# Patient Record
Sex: Female | Born: 1960 | Race: White | Hispanic: No | Marital: Single | Smoking: Former smoker
Health system: Southern US, Community
[De-identification: ages and names within clinical notes are randomized; demographics above are authoritative.]

## PROBLEM LIST (undated history)

## (undated) DIAGNOSIS — E119 Type 2 diabetes mellitus without complications: Secondary | ICD-10-CM

## (undated) DIAGNOSIS — J45909 Unspecified asthma, uncomplicated: Secondary | ICD-10-CM

## (undated) DIAGNOSIS — K219 Gastro-esophageal reflux disease without esophagitis: Secondary | ICD-10-CM

## (undated) DIAGNOSIS — T7840XA Allergy, unspecified, initial encounter: Secondary | ICD-10-CM

## (undated) DIAGNOSIS — J449 Chronic obstructive pulmonary disease, unspecified: Secondary | ICD-10-CM

## (undated) DIAGNOSIS — IMO0001 Reserved for inherently not codable concepts without codable children: Secondary | ICD-10-CM

## (undated) HISTORY — DX: Reserved for inherently not codable concepts without codable children: IMO0001

## (undated) HISTORY — DX: Gastro-esophageal reflux disease without esophagitis: K21.9

## (undated) HISTORY — DX: Unspecified asthma, uncomplicated: J45.909

## (undated) HISTORY — DX: Allergy, unspecified, initial encounter: T78.40XA

---

## 1983-12-23 HISTORY — PX: TONSILLECTOMY: SUR1361

## 2000-01-11 ENCOUNTER — Emergency Department (HOSPITAL_COMMUNITY): Admission: EM | Admit: 2000-01-11 | Discharge: 2000-01-11 | Payer: Self-pay | Admitting: Emergency Medicine

## 2000-01-11 ENCOUNTER — Encounter: Payer: Self-pay | Admitting: Emergency Medicine

## 2002-11-07 ENCOUNTER — Encounter: Payer: Self-pay | Admitting: Internal Medicine

## 2002-11-07 ENCOUNTER — Encounter: Admission: RE | Admit: 2002-11-07 | Discharge: 2002-11-07 | Payer: Self-pay | Admitting: Internal Medicine

## 2002-11-11 ENCOUNTER — Encounter: Admission: RE | Admit: 2002-11-11 | Discharge: 2002-11-11 | Payer: Self-pay | Admitting: Internal Medicine

## 2002-11-11 ENCOUNTER — Encounter: Payer: Self-pay | Admitting: Internal Medicine

## 2006-10-01 ENCOUNTER — Emergency Department (HOSPITAL_COMMUNITY): Admission: EM | Admit: 2006-10-01 | Discharge: 2006-10-01 | Payer: Self-pay | Admitting: Family Medicine

## 2006-10-05 ENCOUNTER — Emergency Department (HOSPITAL_COMMUNITY): Admission: EM | Admit: 2006-10-05 | Discharge: 2006-10-05 | Payer: Self-pay | Admitting: Family Medicine

## 2008-02-06 ENCOUNTER — Emergency Department (HOSPITAL_COMMUNITY): Admission: EM | Admit: 2008-02-06 | Discharge: 2008-02-07 | Payer: Self-pay | Admitting: Emergency Medicine

## 2008-02-08 ENCOUNTER — Ambulatory Visit: Payer: Self-pay | Admitting: Internal Medicine

## 2008-02-08 LAB — CONVERTED CEMR LAB
ALT: 1908 units/L — ABNORMAL HIGH (ref 0–35)
AST: 391 units/L — ABNORMAL HIGH (ref 0–37)
Albumin: 3.8 g/dL (ref 3.5–5.2)
Alkaline Phosphatase: 51 units/L (ref 39–117)
Bilirubin, Direct: 0.3 mg/dL (ref 0.0–0.3)
HCV Ab: NEGATIVE
Hep A IgM: NEGATIVE
Hep B C IgM: NEGATIVE
Hepatitis B Surface Ag: NEGATIVE
INR: 1.1 — ABNORMAL HIGH (ref 0.8–1.0)
Prothrombin Time: 12.6 s (ref 10.9–13.3)
Total Bilirubin: 1 mg/dL (ref 0.3–1.2)
Total Protein: 6.3 g/dL (ref 6.0–8.3)

## 2008-02-14 ENCOUNTER — Ambulatory Visit: Payer: Self-pay | Admitting: Internal Medicine

## 2008-02-14 LAB — CONVERTED CEMR LAB
ALT: 234 units/L — ABNORMAL HIGH (ref 0–35)
AST: 44 units/L — ABNORMAL HIGH (ref 0–37)
Albumin: 3.7 g/dL (ref 3.5–5.2)
Alkaline Phosphatase: 59 units/L (ref 39–117)
Anti Nuclear Antibody(ANA): NEGATIVE
Bilirubin, Direct: 0.1 mg/dL (ref 0.0–0.3)
Total Bilirubin: 0.5 mg/dL (ref 0.3–1.2)
Total Protein: 6.5 g/dL (ref 6.0–8.3)

## 2008-02-29 ENCOUNTER — Ambulatory Visit: Payer: Self-pay | Admitting: Internal Medicine

## 2008-02-29 DIAGNOSIS — B199 Unspecified viral hepatitis without hepatic coma: Secondary | ICD-10-CM | POA: Insufficient documentation

## 2008-02-29 DIAGNOSIS — G4700 Insomnia, unspecified: Secondary | ICD-10-CM | POA: Insufficient documentation

## 2008-02-29 LAB — CONVERTED CEMR LAB
ALT: 28 units/L (ref 0–35)
AST: 20 units/L (ref 0–37)
Albumin: 3.7 g/dL (ref 3.5–5.2)
Total Bilirubin: 0.5 mg/dL (ref 0.3–1.2)

## 2010-01-12 ENCOUNTER — Emergency Department (HOSPITAL_BASED_OUTPATIENT_CLINIC_OR_DEPARTMENT_OTHER): Admission: EM | Admit: 2010-01-12 | Discharge: 2010-01-12 | Payer: Self-pay | Admitting: Emergency Medicine

## 2010-01-12 ENCOUNTER — Ambulatory Visit: Payer: Self-pay | Admitting: Diagnostic Radiology

## 2011-03-10 LAB — CBC
HCT: 33.8 % — ABNORMAL LOW (ref 36.0–46.0)
MCHC: 34.3 g/dL (ref 30.0–36.0)
MCV: 84.7 fL (ref 78.0–100.0)
Platelets: 267 10*3/uL (ref 150–400)
RDW: 12.6 % (ref 11.5–15.5)
WBC: 3.9 10*3/uL — ABNORMAL LOW (ref 4.0–10.5)

## 2011-03-10 LAB — DIFFERENTIAL
Basophils Absolute: 0 10*3/uL (ref 0.0–0.1)
Basophils Relative: 1 % (ref 0–1)
Eosinophils Absolute: 0 10*3/uL (ref 0.0–0.7)
Eosinophils Relative: 1 % (ref 0–5)
Neutrophils Relative %: 59 % (ref 43–77)

## 2011-05-06 NOTE — Assessment & Plan Note (Signed)
[facility]                         GASTROENTEROLOGY OFFICE NOTE   AIREN, DALES                      MRN:          [id number]  DATE:02/14/2008                            DOB:          July 20, 1961    CHIEF COMPLAINT:  Followup of hepatitis.   She is feeling better, eating more.  Nausea persists, but is less  treated with promethazine pills.  No vomiting.  Still getting some right  upper quadrant pain.  No fever.  Still very weak and tired, though  again, better.  She does not feel like she can return to work, and she  brought papers for me to fill out for leave.  Her INR was normal on  February 17.  Her AST and ALT had improved to 391 and 1,908.  There is  no description of jaundice.  She still has no taste for cigarettes.   PAST MEDICAL HISTORY:  Reviewed and unchanged from February 08, 2008.   Her acute hepatitis panel came back negative, her hepatitis A, B and C.   PHYSICAL EXAMINATION:  Weight  172 pounds, pulse 72, blood pressure  110/72.  EYES:  Anicteric.  NECK:  Supple.  ABDOMEN: Soft.  She has some mild right upper quadrant tenderness, but  less than before.  There is no hepatosplenomegaly or mass.   She had complained of some itching in a focal area on the right side.  I  see no rash there.  Perhaps a little bit scaly and dry.   ASSESSMENT:  I still think she has had some sort of viral hepatitis.  Perhaps CMV or EBV.  There is no foreign travel to suggest the other  infectious hepatitities that I can tell i.e., the other acute viral  ones.  Auto-immune hepatitis is in differential.   PLAN:  Hepatic function panel, EBV, IgM antibody, CMV, IgM antibody,  ANA.  I filled out her paperwork and anticipated a month's leave due to  her fatigue issues, etc.  We will reassess her in two weeks.  She can go  back sooner if it seemed to make sense, but at this time it seems likely  that that is an appropriate recommendation..  Further  plans pending  clinic course.     Iva Boop, MD,FACG  Electronically Signed    CEG/MedQ  DD: 02/14/2008  DT: 02/14/2008  Job #: [id number]

## 2011-05-06 NOTE — Assessment & Plan Note (Signed)
[facility]                         GASTROENTEROLOGY OFFICE NOTE   DAJANAE, BROPHY                      MRN:          [id number]  DATE:02/29/2008                            DOB:          [birth year]    CHIEF COMPLAINT:  Follow up of hepatitis.   Ms. Zipp is gradually feeling better.  She still has a lot of nausea  and some diarrhea at times.  her LFTs were almost normalized previously.  She has also complained of some constipation at times.  Her bowel habits  are alternating.  She is due to go back to work at the end of this week.  She feels like she is ready to do this, even though she is somewhat  nervous.  Pain is overall a lot less.  She has been somewhat stressed  and anxious over her brother and sister that had myocardial infarctions  recently.  They have both been in the hospital recently.  She is not  sleeping well.  There has been no fever or chills.  No jaundice.  Levbid, she feels made her nauseous, so she is not using that.  She has  used some Phenergan intermittently.  No other medications.  Her ANA came  back negative.  Previous hepatitis A, B, and C serologies were negative.   PAST MEDICAL HISTORY:  Reviewed, and unchanged from prior.   PHYSICAL EXAMINATION:  Weight 173 pounds.  Pulse 78.  Blood pressure  100/70.  EYES:  Are anicteric.  SKIN:  Without jaundice.   ASSESSMENT:  1. Viral hepatitis.  Exact etiology not known.  She is improving.  2. Suspected irritable bowel syndrome, question related to her viral      hepatitis versus other situational stressors.  3. Insomnia.   PLAN:  1. Continue supportive care.  2. She has not smoked since she developed hepatitis, and she is urged      to try to give it up completely.  3. Return to work March 03, 2008.  On my note I indicated that she may      need a few more breaks or shorter hours.  4. Temazepam 15 mg nightly p.r.n., number 30, no refills.  I told her      that I  would not continue this medication chronically, it is a      short term adjunct therapy for her, and that she should see her      primary care physician if she desired further treatment for same.      Further plans pending LFTs regarding followup.     Iva Boop, MD,FACG  Electronically Signed   CEG/MedQ  DD: 02/29/2008  DT: 02/29/2008  Job #: [id number]

## 2011-05-06 NOTE — Assessment & Plan Note (Signed)
[facility]                         GASTROENTEROLOGY OFFICE NOTE   Andrea, Schneider                      MRN:          [id number]  DATE:02/08/2008                            DOB:          1961/01/17    REFERRING PHYSICIAN:  Melvenia Beam A. Upstill, P.A.   CHIEF COMPLAINT:  Hepatitis.   ASSESSMENT:  A 50 year old white woman with an acute hepatitis with  marked transaminitis and mild elevation of INR. At this point, I think  it is an acute viral hepatitis. She had a negative Tylenol level. She is  not a drinker. I suspect hepatitis A based upon what is seen.   PLAN:  Acute hepatitis panel, recheck LFTs and check pro time INR.  Reassess in one week. Out of work for at least one week. Ways to avoid  transmission of infectious hepatitis were explained to the patient.  Hepatitis handout was given to the patient. She is advised to continue  forcing fluids and have a bland diet. Further plans pending lab results  and clinical course.   HISTORY:  A 50 year old white woman who was doing well until last week.  She has developed nausea and vomiting relatively acute, profound  weakness and lost her taste for cigarettes. She went to the emergency  department and was evaluated on February 15th. She had essentially a  normal CBC with a slight left shift. She has a bilirubin of 1.5, AST of  41 and 44 and an ALT 36/87. She was rehydrated with fluids and felt  somewhat better. Her nausea and vomiting has subsided since that time.  She had pro time of 17.6, which was somewhat elevated. Tylenol level was  less than 10. There is no history of alcohol, needles, recent  intercourse, travel, contacts with sick illnesses or even seafood  ingestion. She is feeling better, but is still very weak. She feels  somewhat constipated. There is no significant abdominal pain. Her GI  review of systems is otherwise negative at this time.   She has not taken any particular medications  at this time. She is on  Phenergan intermittently, but has not needed to use that for a couple of  days.   SHE IS ALLERGIC TO MORPHINE AND HIGH DOSE CODEINE.   PAST MEDICAL HISTORY:  1. Tonsillectomy only.  2. Some stress.   FAMILY HISTORY:  Mother and father had heart disease. Father had  diabetes. No colon cancer or liver disease reported.   SOCIAL HISTORY:  She is single. She lives with her brother. 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 No children. She smokes,  but now is not smoking. Occasional alcohol, but nothing significant. No  drug use.   REVIEW OF SYSTEMS:  Is only positive for some dysmenorrhea. Otherwise,  negative.   PHYSICAL EXAMINATION:  Height 5 feet, 4.5 inches; weight 163 pounds.  Temperature 98.0, blood pressure 112/72, pulse 82 and regular.  EYES: Anicteric.  MOUTH: Posterior pharynx is free of lesions. Mucous membranes are moist.  NECK: Is supple without mass, thyromegaly or adenopathy. There is no  axillary adenopathy.  CHEST:  Is clear.  HEART: S1, S2. I hear no murmur, rubs or gallops.  ABDOMEN: Is soft. She has some mild tenderness over the liver though it  is not palpable. There is no splenomegaly. There is no other  organomegaly or mass.  LOWER EXTREMITIES: Free of edema.  SKIN: Warm and dry without acute rash in the areas I can inspect.  NEURO: Sh appears alert and oriented x3. Cranial nerves II-XII  intact.  Grossly nonfocal.  PSYCH: Appropriate affect.   I have reviewed the ER records. She had an abdominal ultrasound that was  normal as well. All other records reviewed were negative as well and  these are reflected in the computer.     Andrea Boop, MD,FACG  Electronically Signed    CEG/MedQ  DD: 02/08/2008  DT: 02/08/2008  Job #: [id number]   cc:   Thora Lance, M.D.  Shari A. Upstill, P.A.

## 2011-05-09 ENCOUNTER — Emergency Department (HOSPITAL_COMMUNITY)
Admission: EM | Admit: 2011-05-09 | Discharge: 2011-05-09 | Disposition: A | Discharge status: Home / Self Care | Payer: BC Managed Care – PPO | Attending: Emergency Medicine | Admitting: Emergency Medicine

## 2011-05-09 DIAGNOSIS — R42 Dizziness and giddiness: Secondary | ICD-10-CM | POA: Insufficient documentation

## 2011-05-09 DIAGNOSIS — R5381 Other malaise: Secondary | ICD-10-CM | POA: Insufficient documentation

## 2011-05-09 DIAGNOSIS — R51 Headache: Secondary | ICD-10-CM | POA: Insufficient documentation

## 2011-05-09 LAB — CBC
Hemoglobin: 12.2 g/dL (ref 12.0–15.0)
MCH: 25.1 pg — ABNORMAL LOW (ref 26.0–34.0)
Platelets: 373 10*3/uL (ref 150–400)
RBC: 4.86 MIL/uL (ref 3.87–5.11)

## 2011-05-09 LAB — COMPREHENSIVE METABOLIC PANEL
ALT: 20 U/L (ref 0–35)
AST: 17 U/L (ref 0–37)
Albumin: 4.1 g/dL (ref 3.5–5.2)
CO2: 26 mEq/L (ref 19–32)
Calcium: 9.5 mg/dL (ref 8.4–10.5)
Chloride: 103 mEq/L (ref 96–112)
Creatinine, Ser: 0.73 mg/dL (ref 0.4–1.2)
GFR calc Af Amer: >60 mL/min (ref >60)
Sodium: 137 mEq/L (ref 135–145)
Total Bilirubin: 0.2 mg/dL — ABNORMAL LOW (ref 0.3–1.2)

## 2011-05-09 LAB — DIFFERENTIAL
Basophils Absolute: 0 10*3/uL (ref 0.0–0.1)
Basophils Relative: 0 % (ref 0–1)
Eosinophils Absolute: 0.1 10*3/uL (ref 0.0–0.7)
Monocytes Relative: 7 % (ref 3–12)
Neutro Abs: 7.4 10*3/uL (ref 1.7–7.7)
Neutrophils Relative %: 77 % (ref 43–77)

## 2011-05-09 LAB — POCT PREGNANCY, URINE: Preg Test, Ur: NEGATIVE

## 2011-05-09 LAB — URINALYSIS, ROUTINE W REFLEX MICROSCOPIC
Bilirubin Urine: NEGATIVE
Ketones, ur: NEGATIVE mg/dL
Nitrite: NEGATIVE
Protein, ur: NEGATIVE mg/dL
Urobilinogen, UA: 0.2 mg/dL (ref 0.0–1.0)

## 2011-09-12 LAB — HEPATITIS PANEL, ACUTE
HCV Ab: NEGATIVE
Hep A IgM: NEGATIVE
Hepatitis B Surface Ag: NEGATIVE

## 2011-09-12 LAB — URINE MICROSCOPIC-ADD ON

## 2011-09-12 LAB — DIFFERENTIAL
Basophils Relative: 0
Lymphocytes Relative: 4 — ABNORMAL LOW
Lymphs Abs: 0.5 — ABNORMAL LOW
Monocytes Absolute: 0.3
Monocytes Relative: 3
Neutro Abs: 9.8 — ABNORMAL HIGH
Neutrophils Relative %: 92 — ABNORMAL HIGH

## 2011-09-12 LAB — COMPREHENSIVE METABOLIC PANEL
Albumin: 4.1
Alkaline Phosphatase: 67
BUN: 8
Calcium: 9.3
Glucose, Bld: 100 — ABNORMAL HIGH
Potassium: 3.7
Total Protein: 7

## 2011-09-12 LAB — URINALYSIS, ROUTINE W REFLEX MICROSCOPIC
Nitrite: NEGATIVE
Protein, ur: >300 — AB
Specific Gravity, Urine: 1.031 — ABNORMAL HIGH
Urobilinogen, UA: 1
pH: 6.5

## 2011-09-12 LAB — CBC
HCT: 38.5
Hemoglobin: 12.8
MCHC: 33.2
Platelets: 343
RDW: 14.8

## 2011-09-12 LAB — ACETAMINOPHEN LEVEL: Acetaminophen (Tylenol), Serum: <10 — ABNORMAL LOW

## 2011-09-12 LAB — RAPID URINE DRUG SCREEN, HOSP PERFORMED
Barbiturates: NOT DETECTED
Cocaine: NOT DETECTED
Opiates: NOT DETECTED
Tetrahydrocannabinol: NOT DETECTED

## 2011-09-12 LAB — PREGNANCY, URINE: Preg Test, Ur: NEGATIVE

## 2011-09-12 LAB — PROTIME-INR
INR: 1.4
Prothrombin Time: 17.6 — ABNORMAL HIGH

## 2013-05-27 ENCOUNTER — Emergency Department (HOSPITAL_COMMUNITY): Payer: BC Managed Care – PPO

## 2013-05-27 ENCOUNTER — Encounter (HOSPITAL_COMMUNITY): Payer: Self-pay | Admitting: Cardiology

## 2013-05-27 ENCOUNTER — Emergency Department (HOSPITAL_COMMUNITY)
Admission: EM | Admit: 2013-05-27 | Discharge: 2013-05-27 | Disposition: A | Discharge status: Home / Self Care | Payer: BC Managed Care – PPO | Attending: Emergency Medicine | Admitting: Emergency Medicine

## 2013-05-27 DIAGNOSIS — F17200 Nicotine dependence, unspecified, uncomplicated: Secondary | ICD-10-CM | POA: Insufficient documentation

## 2013-05-27 DIAGNOSIS — F411 Generalized anxiety disorder: Secondary | ICD-10-CM | POA: Insufficient documentation

## 2013-05-27 DIAGNOSIS — N951 Menopausal and female climacteric states: Secondary | ICD-10-CM | POA: Insufficient documentation

## 2013-05-27 DIAGNOSIS — R5381 Other malaise: Secondary | ICD-10-CM | POA: Insufficient documentation

## 2013-05-27 DIAGNOSIS — F419 Anxiety disorder, unspecified: Secondary | ICD-10-CM

## 2013-05-27 DIAGNOSIS — R0602 Shortness of breath: Secondary | ICD-10-CM | POA: Insufficient documentation

## 2013-05-27 DIAGNOSIS — R61 Generalized hyperhidrosis: Secondary | ICD-10-CM | POA: Insufficient documentation

## 2013-05-27 DIAGNOSIS — Z79899 Other long term (current) drug therapy: Secondary | ICD-10-CM | POA: Insufficient documentation

## 2013-05-27 DIAGNOSIS — R42 Dizziness and giddiness: Secondary | ICD-10-CM | POA: Insufficient documentation

## 2013-05-27 LAB — CBC
Hemoglobin: 14.5 g/dL (ref 12.0–15.0)
MCH: 29.8 pg (ref 26.0–34.0)
Platelets: 272 10*3/uL (ref 150–400)
RBC: 4.87 MIL/uL (ref 3.87–5.11)
WBC: 6.4 10*3/uL (ref 4.0–10.5)

## 2013-05-27 LAB — URINALYSIS, ROUTINE W REFLEX MICROSCOPIC
Glucose, UA: NEGATIVE mg/dL
Hgb urine dipstick: NEGATIVE
Leukocytes, UA: NEGATIVE
pH: 7.5 (ref 5.0–8.0)

## 2013-05-27 LAB — COMPREHENSIVE METABOLIC PANEL
ALT: 40 U/L — ABNORMAL HIGH (ref 0–35)
AST: 29 U/L (ref 0–37)
Alkaline Phosphatase: 99 U/L (ref 39–117)
CO2: 24 mEq/L (ref 19–32)
Calcium: 9.2 mg/dL (ref 8.4–10.5)
GFR calc non Af Amer: >90 mL/min (ref >90)
Potassium: 3.7 mEq/L (ref 3.5–5.1)
Sodium: 134 mEq/L — ABNORMAL LOW (ref 135–145)

## 2013-05-27 LAB — POCT I-STAT TROPONIN I: Troponin i, poc: 0 ng/mL (ref 0.00–0.08)

## 2013-05-27 MED ORDER — ALPRAZOLAM 0.25 MG PO TABS: 0.2500 mg | ORAL_TABLET | Freq: Every evening | ORAL | Status: DC | PRN

## 2013-05-27 NOTE — ED Notes (Signed)
Pt reports intermittant chest pain x 1 week. States did yard work last week and also is a Nature conservation officer for work. Reports shortness of breath at times, but has sharp pains while lying down. States not worse when taking a deep breath or movement. Pt appears anxious, states hx of heart disease in family and is worried about her heart. Also reports got bit by several mosquitoes and afraid this may be contributing to her symptoms. Had sore throat/fever earlier this week. Denies these symptoms now. Reports some light headed, feeling as if legs will fall out from under her.

## 2013-05-27 NOTE — ED Provider Notes (Signed)
History     [name]: [id number]  Arrival date & time 05/27/13  8295   First MD Initiated Contact with Patient 05/27/13 [phone]      Chief Complaint  Patient presents with  . Numbness  . Shortness of Breath    (Consider location/radiation/quality/duration/timing/severity/associated sxs/prior treatment) HPI Pt with episodic hot flashes, SOb and lower ext numbness for the past several day. At this time, no numbness, SOB. Pt states she is having infrequent period and believes she is perimenopausal. Pt admits to increased stress in life. +anxiety. No cough, fever, chills, CP, abd pain, N/V/D. No urinary symptoms. No recent travel, surgeries. No lower ext swelling or pain.  History reviewed. No pertinent past medical history.  History reviewed. No pertinent past surgical history.  History reviewed. No pertinent family history.  History  Substance Use Topics  . Smoking status: Current Every Day Smoker  . Smokeless tobacco: Not on file  . Alcohol Use: No    OB History   Grav Para Term Preterm Abortions TAB SAB Ect Mult Living                  Review of Systems  Constitutional: Positive for diaphoresis and fatigue. Negative for fever and chills.  Respiratory: Positive for cough and shortness of breath. Negative for wheezing.   Cardiovascular: Negative for chest pain, palpitations and leg swelling.  Gastrointestinal: Negative for nausea, vomiting, abdominal pain and diarrhea.  Endocrine: Positive for heat intolerance.  Musculoskeletal: Negative for back pain.  Skin: Negative for rash and wound.  Neurological: Positive for dizziness and light-headedness. Negative for weakness, numbness and headaches.  All other systems reviewed and are negative.    Allergies  Codeine  Home Medications   Current Outpatient Rx  Name  Route  Sig  Dispense  Refill  . DiphenhydrAMINE HCl (BENADRYL PO)   Oral   Take 5-10 mLs by mouth at bedtime as needed. For sleep         . ALPRAZolam (XANAX)  0.25 MG tablet   Oral   Take 1 tablet (0.25 mg total) by mouth at bedtime as needed for sleep.   30 tablet   0     BP 101/68  Pulse 72  Temp(Src) 98.1 F (36.7 C) (Oral)  Resp 19  SpO2 94%  Physical Exam  Nursing note and vitals reviewed. Constitutional: She is oriented to person, place, and time. She appears well-developed and well-nourished. No distress.  HENT:  Head: Normocephalic and atraumatic.  Mouth/Throat: Oropharynx is clear and moist. No oropharyngeal exudate.  Eyes: EOM are normal. Pupils are equal, round, and reactive to light.  Neck: Normal range of motion. Neck supple.  Cardiovascular: Normal rate and regular rhythm.  Exam reveals no gallop and no friction rub.   No murmur heard. Pulmonary/Chest: Effort normal and breath sounds normal. No respiratory distress. She has no wheezes. She has no rales. She exhibits no tenderness.  Abdominal: Soft. Bowel sounds are normal. She exhibits no distension and no mass. There is no tenderness. There is no rebound and no guarding.  Musculoskeletal: Normal range of motion. She exhibits no edema and no tenderness.  No calf swelling or tenderness  Neurological: She is alert and oriented to person, place, and time.  5/5 motor, sensation intact  Skin: Skin is warm and dry. No rash noted. No erythema.  Psychiatric: She has a normal mood and affect. Her behavior is normal.    ED Course  Procedures (including critical care time)  Labs Reviewed  COMPREHENSIVE METABOLIC PANEL - Abnormal; Notable for the following:    Sodium 134 (*)    Glucose, Bld 109 (*)    ALT 40 (*)    All other components within normal limits  CBC  TROPONIN I  D-DIMER, QUANTITATIVE  URINALYSIS, ROUTINE W REFLEX MICROSCOPIC  POCT I-STAT TROPONIN I   Dg Chest 2 View  05/27/2013   *RADIOLOGY REPORT*  Clinical Data: Chest pain and shortness of breath.  CHEST - 2 VIEW  Comparison: 01/12/2010  Findings: The lungs are clear and show no evidence of infiltrate,  edema or nodule.  No pleural fluid is seen.  Cardiac mediastinal contours are within normal limits.  There are mild and stable degenerative changes of the thoracic spine.  IMPRESSION: No active disease.   Original Report Authenticated By: Irish Lack, M.D.     1. Menopausal hot flushes   2. Anxiety      Date: 05/27/2013  Rate: 95  Rhythm: normal sinus rhythm  QRS Axis: normal  Intervals: normal  ST/T Wave abnormalities: normal  Conduction Disutrbances:none  Narrative Interpretation:   Old EKG Reviewed: none available    MDM   Doubt CAD. Symptoms consistent with perimenopausal and anxiety. Have given pt resources for outpt f/u. Given return precautions.        Loren Racer, MD 05/27/13 1218

## 2013-05-27 NOTE — ED Notes (Signed)
Pt reports that over the past couple of days her legs had felt numb and had difficulty catching her breath. Denies any pain at this time.  Pt tearful at triage.

## 2014-08-02 ENCOUNTER — Ambulatory Visit (INDEPENDENT_AMBULATORY_CARE_PROVIDER_SITE_OTHER): Payer: BC Managed Care – PPO | Admitting: Family Medicine

## 2014-08-02 ENCOUNTER — Ambulatory Visit (INDEPENDENT_AMBULATORY_CARE_PROVIDER_SITE_OTHER): Payer: BC Managed Care – PPO

## 2014-08-02 VITALS — BP 124/88 | HR 90 | Temp 98.0°F | Resp 16 | Ht 64.5 in | Wt 194.4 lb

## 2014-08-02 DIAGNOSIS — R05 Cough: Secondary | ICD-10-CM

## 2014-08-02 DIAGNOSIS — J209 Acute bronchitis, unspecified: Secondary | ICD-10-CM

## 2014-08-02 DIAGNOSIS — R059 Cough, unspecified: Secondary | ICD-10-CM

## 2014-08-02 DIAGNOSIS — J0100 Acute maxillary sinusitis, unspecified: Secondary | ICD-10-CM

## 2014-08-02 DIAGNOSIS — R0982 Postnasal drip: Secondary | ICD-10-CM

## 2014-08-02 MED ORDER — BENZONATATE 100 MG PO CAPS: 200.0000 mg | ORAL_CAPSULE | Freq: Two times a day (BID) | ORAL | Status: DC | PRN

## 2014-08-02 MED ORDER — ALBUTEROL SULFATE HFA 108 (90 BASE) MCG/ACT IN AERS: 2.0000 | INHALATION_SPRAY | Freq: Four times a day (QID) | RESPIRATORY_TRACT | Status: DC | PRN

## 2014-08-02 MED ORDER — AZITHROMYCIN 250 MG PO TABS: ORAL_TABLET | ORAL | Status: DC

## 2014-08-02 MED ORDER — HYDROCODONE-HOMATROPINE 5-1.5 MG/5ML PO SYRP
5.0000 mL | ORAL_SOLUTION | Freq: Every evening | ORAL | Status: AC | PRN
Start: 2014-08-02 — End: ?

## 2014-08-02 NOTE — Patient Instructions (Signed)

## 2014-08-02 NOTE — Progress Notes (Signed)
Chief Complaint:  Chief Complaint  Patient presents with  . Cough    X 1 month  . Nasal Congestion    X 1 month  . Chest Congestion    X 1 month    HPI: Andrea Schneider is a 53 y.o. female who is here for  URI sxs for the last 1 month, has SOband also white productive, has a hsitory of allegies, also asthma as a child. She was in the garden and started having allergy sxs but never ;asted this long. No feers or chills, + flushed in the face, she has no fevers. She ahs chills, no ear pain but has ringing in her ears, stopped up. HAs tried otc meds without releif. Brothers have been sick   Past Medical History  Diagnosis Date  . Allergy   . Asthma    History reviewed. No pertinent past surgical history. History   Social History  . Marital Status: Single    Spouse Name: N/A    Number of Children: N/A  . Years of Education: N/A   Social History Main Topics  . Smoking status: Current Every Day Smoker  . Smokeless tobacco: None  . Alcohol Use: No  . Drug Use: No  . Sexual Activity: None   Other Topics Concern  . None   Social History Narrative  . None   Family History  Problem Relation Age of Onset  . Heart disease Mother   . Diabetes Father   . Heart disease Father   . Heart disease Sister   . Diabetes Brother   . Heart disease Brother   . Hypertension Brother    Allergies  Allergen Reactions  . Codeine Nausea And Vomiting  . Penicillins Nausea And Vomiting   Prior to Admission medications   Medication Sig Start Date End Date Taking? Authorizing Provider  ALPRAZolam (XANAX) 0.25 MG tablet Take 1 tablet (0.25 mg total) by mouth at bedtime as needed for sleep. 05/27/13   Loren Racer, MD  DiphenhydrAMINE HCl (BENADRYL PO) Take 5-10 mLs by mouth at bedtime as needed. For sleep    Historical Provider, MD     ROS: The patient denies fevers, chills, night sweats, unintentional weight loss, chest pain, palpitations, wheezing, dyspnea on exertion, nausea,  vomiting, abdominal pain, dysuria, hematuria, melena, numbness, weakness, or tingling.   All other systems have been reviewed and were otherwise negative with the exception of those mentioned in the HPI and as above.    PHYSICAL EXAM: Filed Vitals:   08/02/14 1114  BP: 124/88  Pulse: 90  Temp: 98 F (36.7 C)  Resp: 16   Filed Vitals:   08/02/14 1114  Height: 5' 4.5" (1.638 m)  Weight: 194 lb 6.4 oz (88.179 kg)   Body mass index is 32.87 kg/(m^2).  General: Alert, no acute distress HEENT:  Normocephalic, atraumatic, oropharynx patent. EOMI, PERRLA,  +PND, TM normal, + sinus tenderness Cardiovascular:  Regular rate and rhythm, no rubs murmurs or gallops.  No Carotid bruits, radial pulse intact. No pedal edema.  Respiratory: Clear to auscultation bilaterally.  No wheezes, rales, or rhonchi.  No cyanosis, no use of accessory musculature GI: No organomegaly, abdomen is soft and non-tender, positive bowel sounds.  No masses. Skin: No rashes. Neurologic: Facial musculature symmetric. Psychiatric: Patient is appropriate throughout our interaction. Lymphatic: No cervical lymphadenopathy Musculoskeletal: Gait intact.   LABS: Results for orders placed during the hospital encounter of 05/27/13  CBC  Result Value Ref Range   WBC 6.4  4.0 - 10.5 K/uL   RBC 4.87  3.87 - 5.11 MIL/uL   Hemoglobin 14.5  12.0 - 15.0 g/dL   HCT 16.141.9  09.636.0 - 04.546.0 %   MCV 86.0  78.0 - 100.0 fL   MCH 29.8  26.0 - 34.0 pg   MCHC 34.6  30.0 - 36.0 g/dL   RDW 40.913.3  81.111.5 - 91.415.5 %   Platelets 272  150 - 400 K/uL  COMPREHENSIVE METABOLIC PANEL      Result Value Ref Range   Sodium 134 (*) 135 - 145 mEq/L   Potassium 3.7  3.5 - 5.1 mEq/L   Chloride 99  96 - 112 mEq/L   CO2 24  19 - 32 mEq/L   Glucose, Bld 109 (*) 70 - 99 mg/dL   BUN 10  6 - 23 mg/dL   Creatinine, Ser 7.820.50  0.50 - 1.10 mg/dL   Calcium 9.2  8.4 - 95.610.5 mg/dL   Total Protein 7.2  6.0 - 8.3 g/dL   Albumin 3.8  3.5 - 5.2 g/dL   AST 29  0 - 37  U/L   ALT 40 (*) 0 - 35 U/L   Alkaline Phosphatase 99  39 - 117 U/L   Total Bilirubin 0.3  0.3 - 1.2 mg/dL   GFR calc non Af Amer >90  >90 mL/min   GFR calc Af Amer >90  >90 mL/min  TROPONIN I      Result Value Ref Range   Troponin I <0.30  <0.30 ng/mL  D-DIMER, QUANTITATIVE      Result Value Ref Range   D-Dimer, Quant <0.27  0.00 - 0.48 ug/mL-FEU  URINALYSIS, ROUTINE W REFLEX MICROSCOPIC      Result Value Ref Range   Color, Urine YELLOW  YELLOW   APPearance CLEAR  CLEAR   Specific Gravity, Urine 1.011  1.005 - 1.030   pH 7.5  5.0 - 8.0   Glucose, UA NEGATIVE  NEGATIVE mg/dL   Hgb urine dipstick NEGATIVE  NEGATIVE   Bilirubin Urine NEGATIVE  NEGATIVE   Ketones, ur NEGATIVE  NEGATIVE mg/dL   Protein, ur NEGATIVE  NEGATIVE mg/dL   Urobilinogen, UA 0.2  0.0 - 1.0 mg/dL   Nitrite NEGATIVE  NEGATIVE   Leukocytes, UA NEGATIVE  NEGATIVE  POCT I-STAT TROPONIN I      Result Value Ref Range   Troponin i, poc 0.00  0.00 - 0.08 ng/mL   Comment 3              EKG/XRAY:   [facility]. No acute cardiopulmonary disease   ASSESSMENT/PLAN: Encounter Diagnoses  Name Primary?  . Cough Yes  . Acute maxillary sinusitis, recurrence not specified   . Acute bronchitis, unspecified organism   . PND (post-nasal drip)    Rx azithromycin , tessalon perles, nasacort, and also hycodan, also albuterol prn She is still going to taking allergy meds She has had hycodan in the past without complications F/u prn  Gross sideeffects, risk and benefits, and alternatives of medications d/w patient. Patient is aware that all medications have potential sideeffects and we are unable to predict every sideeffect or drug-drug interaction that may occur.  Hamilton CapriLE, Kaelyn Innocent PHUONG, DO 08/02/2014 12:21 PM

## 2014-08-04 ENCOUNTER — Telehealth: Payer: Self-pay

## 2014-08-04 DIAGNOSIS — R05 Cough: Secondary | ICD-10-CM

## 2014-08-04 DIAGNOSIS — R059 Cough, unspecified: Secondary | ICD-10-CM

## 2014-08-04 DIAGNOSIS — J0100 Acute maxillary sinusitis, unspecified: Secondary | ICD-10-CM

## 2014-08-04 DIAGNOSIS — R0982 Postnasal drip: Secondary | ICD-10-CM

## 2014-08-04 DIAGNOSIS — J209 Acute bronchitis, unspecified: Secondary | ICD-10-CM

## 2014-08-04 MED ORDER — ALBUTEROL SULFATE HFA 108 (90 BASE) MCG/ACT IN AERS: 2.0000 | INHALATION_SPRAY | Freq: Four times a day (QID) | RESPIRATORY_TRACT | Status: DC | PRN

## 2014-08-04 NOTE — Telephone Encounter (Signed)
Pt called in and states she is having sob and wants to know if she can have a inhaler. She can be reached @ [phone] or [phone]. Thank you

## 2014-08-04 NOTE — Telephone Encounter (Signed)
Resent inhaler to the pharmacy. Pt did not get this. She will RTC if needed.

## 2015-01-28 ENCOUNTER — Encounter (HOSPITAL_COMMUNITY): Payer: Self-pay | Admitting: *Deleted

## 2015-01-28 ENCOUNTER — Emergency Department (INDEPENDENT_AMBULATORY_CARE_PROVIDER_SITE_OTHER)
Admission: EM | Admit: 2015-01-28 | Discharge: 2015-01-28 | Disposition: A | Discharge status: Home / Self Care | Payer: BLUE CROSS/BLUE SHIELD | Source: Home / Self Care | Attending: Family Medicine | Admitting: Family Medicine

## 2015-01-28 DIAGNOSIS — J449 Chronic obstructive pulmonary disease, unspecified: Secondary | ICD-10-CM

## 2015-01-28 DIAGNOSIS — R05 Cough: Secondary | ICD-10-CM

## 2015-01-28 DIAGNOSIS — J069 Acute upper respiratory infection, unspecified: Secondary | ICD-10-CM

## 2015-01-28 DIAGNOSIS — J0100 Acute maxillary sinusitis, unspecified: Secondary | ICD-10-CM

## 2015-01-28 DIAGNOSIS — J209 Acute bronchitis, unspecified: Secondary | ICD-10-CM

## 2015-01-28 DIAGNOSIS — IMO0001 Reserved for inherently not codable concepts without codable children: Secondary | ICD-10-CM

## 2015-01-28 DIAGNOSIS — Z720 Tobacco use: Secondary | ICD-10-CM

## 2015-01-28 DIAGNOSIS — R0982 Postnasal drip: Secondary | ICD-10-CM

## 2015-01-28 DIAGNOSIS — R059 Cough, unspecified: Secondary | ICD-10-CM

## 2015-01-28 MED ORDER — PREDNISONE 20 MG PO TABS
50.0000 mg | ORAL_TABLET | Freq: Once | ORAL | Status: AC
  Administered 2015-01-28: 50 mg via ORAL

## 2015-01-28 MED ORDER — IPRATROPIUM-ALBUTEROL 0.5-2.5 (3) MG/3ML IN SOLN
3.0000 mL | Freq: Once | RESPIRATORY_TRACT | Status: AC
  Administered 2015-01-28: 3 mL via RESPIRATORY_TRACT

## 2015-01-28 MED ORDER — ALBUTEROL SULFATE HFA 108 (90 BASE) MCG/ACT IN AERS
2.0000 | INHALATION_SPRAY | Freq: Four times a day (QID) | RESPIRATORY_TRACT | Status: AC | PRN
Start: 2015-01-28 — End: ?

## 2015-01-28 MED ORDER — IPRATROPIUM BROMIDE 0.06 % NA SOLN: 2.0000 | Freq: Four times a day (QID) | NASAL | Status: AC

## 2015-01-28 MED ORDER — DOXYCYCLINE HYCLATE 100 MG PO TABS: 100.0000 mg | ORAL_TABLET | Freq: Two times a day (BID) | ORAL | Status: DC

## 2015-01-28 MED ORDER — HYDROCOD POLST-CHLORPHEN POLST 10-8 MG/5ML PO LQCR: 2.5000 mL | Freq: Every evening | ORAL | Status: DC | PRN

## 2015-01-28 MED ORDER — PREDNISONE 50 MG PO TABS: 50.0000 mg | ORAL_TABLET | Freq: Once | ORAL | Status: DC

## 2015-01-28 NOTE — ED Provider Notes (Addendum)
[name]: [id number]     Arrival date & time 01/28/15  1347 History   None    No chief complaint on file.  (Consider location/radiation/quality/duration/timing/severity/associated sxs/prior Treatment) HPI  Coughing and wheezing. Ongoing for 7-10 days. No change. Robitussin DM, w/o benefit. Delsym pm w/ some benefit. Ran out of albuterol. Unable to sleep due to cough. Cough is productive. Felt warm sometimes but no actual fevers. CHills. Denies CP, palpitations, SOB, syncope.   Tobacco: last cigarette 5 days ago. Trying to quit.    Past Medical History  Diagnosis Date  . Allergy   . Asthma    Past Surgical History  Procedure Laterality Date  . Tonsillectomy  1985   Family History  Problem Relation Age of Onset  . Heart disease Mother   . Diabetes Father   . Heart disease Father   . Heart disease Sister   . Diabetes Brother   . Heart disease Brother   . Hypertension Brother    History  Substance Use Topics  . Smoking status: Current Every Day Smoker -- 0.10 packs/day    Types: Cigarettes  . Smokeless tobacco: Not on file  . Alcohol Use: No   OB History    No data available     Review of Systems Per HPI with all other pertinent systems negative.   Allergies  Codeine and Penicillins  Home Medications   Prior to Admission medications   Medication Sig Start Date End Date Taking? Authorizing Provider  albuterol (PROVENTIL HFA;VENTOLIN HFA) 108 (90 BASE) MCG/ACT inhaler Inhale 2 puffs into the lungs every 6 (six) hours as needed for wheezing or shortness of breath. 01/28/15   Ozella Rocks, MD  ALPRAZolam Prudy Feeler) 0.25 MG tablet Take 1 tablet (0.25 mg total) by mouth at bedtime as needed for sleep. 05/27/13   Loren Racer, MD  azithromycin (ZITHROMAX) 250 MG tablet Take 2 tabs po now then 1 tab po daily for the next 4 days 08/02/14   Thao P Le, DO  benzonatate (TESSALON) 100 MG capsule Take 2 capsules (200 mg total) by mouth 2 (two) times daily as needed. 08/02/14   Thao P Le,  DO  chlorpheniramine-HYDROcodone (TUSSIONEX PENNKINETIC ER) 10-8 MG/5ML LQCR Take 2.5-5 mLs by mouth at bedtime as needed for cough. 01/28/15   Ozella Rocks, MD  DiphenhydrAMINE HCl (BENADRYL PO) Take 5-10 mLs by mouth at bedtime as needed. For sleep    Historical Provider, MD  doxycycline (VIBRA-TABS) 100 MG tablet Take 1 tablet (100 mg total) by mouth 2 (two) times daily. 01/28/15   Ozella Rocks, MD  HYDROcodone-homatropine [facility]) 5-1.5 MG/5ML syrup Take 5 mLs by mouth at bedtime as needed. 08/02/14   Thao P Le, DO  predniSONE (DELTASONE) 50 MG tablet Take 1 tablet (50 mg total) by mouth once. 01/28/15   Ozella Rocks, MD   BP 148/90 mmHg  Pulse 97  Temp(Src) 98.2 F (36.8 C) (Oral)  Resp 16  SpO2 98% Physical Exam  Constitutional: She is oriented to person, place, and time. She appears well-developed and well-nourished. No distress.  HENT:  Head: Normocephalic and atraumatic.  Nasal speech  Eyes: EOM are normal. Pupils are equal, round, and reactive to light.  Neck: Normal range of motion.  Cardiovascular: Normal rate and normal heart sounds.   No murmur heard. Pulmonary/Chest:  Mild increased work of breathing. Few intermittent wheezes and crackles bilaterally  Abdominal: Soft. Bowel sounds are normal.  Musculoskeletal: She exhibits no edema or tenderness.  Neurological:  She is alert and oriented to person, place, and time.  Skin: Skin is warm. She is not diaphoretic.  Psychiatric: She has a normal mood and affect. Her behavior is normal. Judgment and thought content normal.    ED Course  Procedures (including critical care time) Labs Review Labs Reviewed - No data to display  Imaging Review No results found.   MDM   1. COPD bronchitis   2. URI (upper respiratory infection)   3. Cough   4. Acute maxillary sinusitis, recurrence not specified   5. Acute bronchitis, unspecified organism   6. PND (post-nasal drip)   7. Tobacco use    Tobacco use: Patient very  motivated and current illness has prevented patient from smoking further. Patient to use this as an opportunity to quit smoking. Will seek further medical regimens if needed through her PCP.  Bronchitis/COPD: No formal diagnosis of COPD but given long-standing history of smoking and current symptomatology would suspect patient has underlying COPD. Treat as such at this time. DuoNeb and prednisone 50 mg by mouth given in office. Start 5 days of prednisone, doxycycline, and albuterol every 4 hours for the next 24 hours. Tussionex for cough. Nasal Atrovent for postnasal drip. Precautions given and all questions answered   Shelly Flattenavid Merrell, MD Family Medicine 01/28/2015, 3:34 PM       Ozella Rocksavid J Merrell, MD 01/28/15 1535  Ozella Rocksavid J Merrell, MD 01/28/15 [phone]

## 2015-01-28 NOTE — ED Notes (Signed)
C/o prod.cough clear sputum for 1 1/2 weeks.  States when she coughs she gets SOB and wheeze. Still smokes but has not had on in 5 days.  Face got hot but did not check her temp.  Has been getting " the sweats."

## 2015-01-28 NOTE — Discharge Instructions (Signed)
Your symptoms are likely from a mild COPD exacerbation. This may be a new diagnosis for you and I recommend further follow-up at your PCPs office. Please had antibiotics and take them to their completion. Please continue the steroids and albuterol every 4 hours for the next 24 hours Please consider using the nasal Atrovent to help with postnasal drip

## 2015-02-03 ENCOUNTER — Emergency Department (HOSPITAL_COMMUNITY)
Admission: EM | Admit: 2015-02-03 | Discharge: 2015-02-03 | Disposition: A | Discharge status: Home / Self Care | Payer: BLUE CROSS/BLUE SHIELD | Attending: Emergency Medicine | Admitting: Emergency Medicine

## 2015-02-03 ENCOUNTER — Encounter (HOSPITAL_COMMUNITY): Payer: Self-pay

## 2015-02-03 ENCOUNTER — Emergency Department (INDEPENDENT_AMBULATORY_CARE_PROVIDER_SITE_OTHER)
Admission: EM | Admit: 2015-02-03 | Discharge: 2015-02-03 | Disposition: A | Discharge status: Nursing Home (Includes ICF) | Payer: BLUE CROSS/BLUE SHIELD | Source: Home / Self Care | Attending: Family Medicine | Admitting: Family Medicine

## 2015-02-03 ENCOUNTER — Emergency Department (HOSPITAL_COMMUNITY): Payer: BLUE CROSS/BLUE SHIELD

## 2015-02-03 ENCOUNTER — Emergency Department (INDEPENDENT_AMBULATORY_CARE_PROVIDER_SITE_OTHER): Payer: BLUE CROSS/BLUE SHIELD

## 2015-02-03 DIAGNOSIS — Z880 Allergy status to penicillin: Secondary | ICD-10-CM | POA: Insufficient documentation

## 2015-02-03 DIAGNOSIS — R0602 Shortness of breath: Secondary | ICD-10-CM | POA: Diagnosis present

## 2015-02-03 DIAGNOSIS — R0600 Dyspnea, unspecified: Secondary | ICD-10-CM

## 2015-02-03 DIAGNOSIS — J449 Chronic obstructive pulmonary disease, unspecified: Secondary | ICD-10-CM

## 2015-02-03 DIAGNOSIS — Z79899 Other long term (current) drug therapy: Secondary | ICD-10-CM | POA: Diagnosis not present

## 2015-02-03 DIAGNOSIS — R05 Cough: Secondary | ICD-10-CM

## 2015-02-03 DIAGNOSIS — R059 Cough, unspecified: Secondary | ICD-10-CM

## 2015-02-03 DIAGNOSIS — J441 Chronic obstructive pulmonary disease with (acute) exacerbation: Secondary | ICD-10-CM | POA: Diagnosis not present

## 2015-02-03 DIAGNOSIS — R079 Chest pain, unspecified: Secondary | ICD-10-CM | POA: Diagnosis not present

## 2015-02-03 DIAGNOSIS — Z720 Tobacco use: Secondary | ICD-10-CM | POA: Insufficient documentation

## 2015-02-03 DIAGNOSIS — Z7952 Long term (current) use of systemic steroids: Secondary | ICD-10-CM | POA: Diagnosis not present

## 2015-02-03 HISTORY — DX: Chronic obstructive pulmonary disease, unspecified: J44.9

## 2015-02-03 LAB — CBC
HEMATOCRIT: 45.6 % (ref 36.0–46.0)
Hemoglobin: 15.6 g/dL — ABNORMAL HIGH (ref 12.0–15.0)
MCH: 30.1 pg (ref 26.0–34.0)
MCHC: 34.2 g/dL (ref 30.0–36.0)
MCV: 88 fL (ref 78.0–100.0)
Platelets: 323 10*3/uL (ref 150–400)
RBC: 5.18 MIL/uL — AB (ref 3.87–5.11)
RDW: 13 % (ref 11.5–15.5)
WBC: 9.2 10*3/uL (ref 4.0–10.5)

## 2015-02-03 LAB — BASIC METABOLIC PANEL
Anion gap: 11 (ref 5–15)
BUN: 7 mg/dL (ref 6–23)
CO2: 26 mmol/L (ref 19–32)
Calcium: 8.9 mg/dL (ref 8.4–10.5)
Chloride: 99 mmol/L (ref 96–112)
Creatinine, Ser: 0.6 mg/dL (ref 0.50–1.10)
GLUCOSE: 112 mg/dL — AB (ref 70–99)
Potassium: 3.6 mmol/L (ref 3.5–5.1)
SODIUM: 136 mmol/L (ref 135–145)

## 2015-02-03 LAB — I-STAT TROPONIN, ED: TROPONIN I, POC: 0 ng/mL (ref 0.00–0.08)

## 2015-02-03 MED ORDER — HYDROCOD POLST-CHLORPHEN POLST 10-8 MG/5ML PO LQCR: 2.5000 mL | Freq: Every evening | ORAL | Status: AC | PRN

## 2015-02-03 MED ORDER — IOHEXOL 350 MG/ML SOLN
100.0000 mL | Freq: Once | INTRAVENOUS | Status: AC | PRN
  Administered 2015-02-03: 100 mL via INTRAVENOUS

## 2015-02-03 MED ORDER — GUAIFENESIN-DM 100-10 MG/5ML PO SYRP: 5.0000 mL | ORAL_SOLUTION | Freq: Three times a day (TID) | ORAL | Status: DC | PRN

## 2015-02-03 NOTE — ED Notes (Signed)
Sob x 2 days; dx. Copd. Given 5 prescriptions. Productive cough. No fevers.

## 2015-02-03 NOTE — Discharge Instructions (Signed)
Cough, Adult  A cough is a reflex that helps clear your throat and airways. It can help heal the body or may be a reaction to an irritated airway. A cough may only last 2 or 3 weeks (acute) or may last more than 8 weeks (chronic).  CAUSES Acute cough:  Viral or bacterial infections. Chronic cough:  Infections.  Allergies.  Asthma.  Post-nasal drip.  Smoking.  Heartburn or acid reflux.  Some medicines.  Chronic lung problems (COPD).  Cancer. SYMPTOMS   Cough.  Fever.  Chest pain.  Increased breathing rate.  High-pitched whistling sound when breathing (wheezing).  Colored mucus that you cough up (sputum). TREATMENT   A bacterial cough may be treated with antibiotic medicine.  A viral cough must run its course and will not respond to antibiotics.  Your caregiver may recommend other treatments if you have a chronic cough. HOME CARE INSTRUCTIONS   Only take over-the-counter or prescription medicines for pain, discomfort, or fever as directed by your caregiver. Use cough suppressants only as directed by your caregiver.  Use a cold steam vaporizer or humidifier in your bedroom or home to help loosen secretions.  Sleep in a semi-upright position if your cough is worse at night.  Rest as needed.  Stop smoking if you smoke. SEEK IMMEDIATE MEDICAL CARE IF:  1. You have pus in your sputum. 2. Your cough starts to worsen. 3. You cannot control your cough with suppressants and are losing sleep. 4. You begin coughing up blood. 5. You have difficulty breathing. 6. You develop pain which is getting worse or is uncontrolled with medicine. 7. You have a fever. MAKE SURE YOU:   Understand these instructions.  Will watch your condition.  Will get help right away if you are not doing well or get worse. Document Released: 06/06/2011 Document Revised: 03/01/2012 Document Reviewed: 06/06/2011 [facility] Patient Information 2015 [place], [place]. This information is not  intended to replace advice given to you by your health care provider. Make sure you discuss any questions you have with your health care provider.  Chronic Obstructive Pulmonary Disease Chronic obstructive pulmonary disease (COPD) is a common lung condition in which airflow from the lungs is limited. COPD is a general term that can be used to describe many different lung problems that limit airflow, including both chronic bronchitis and emphysema. If you have COPD, your lung function will probably never return to normal, but there are measures you can take to improve lung function and make yourself feel better.  CAUSES   Smoking (common).   Exposure to secondhand smoke.   Genetic problems.  Chronic inflammatory lung diseases or recurrent infections. SYMPTOMS   Shortness of breath, especially with physical activity.   Deep, persistent (chronic) cough with a large amount of thick mucus.   Wheezing.   Rapid breaths (tachypnea).   Gray or bluish discoloration (cyanosis) of the skin, especially in fingers, toes, or lips.   Fatigue.   Weight loss.   Frequent infections or episodes when breathing symptoms become much worse (exacerbations).   Chest tightness. DIAGNOSIS  Your health care provider will take a medical history and perform a physical examination to make the initial diagnosis. Additional tests for COPD may include:   Lung (pulmonary) function tests.  Chest X-ray.  CT scan.  Blood tests. TREATMENT  Treatment available to help you feel better when you have COPD includes:   Inhaler and nebulizer medicines. These help manage the symptoms of COPD and make your breathing more  comfortable.  Supplemental oxygen. Supplemental oxygen is only helpful if you have a low oxygen level in your blood.   Exercise and physical activity. These are beneficial for nearly all people with COPD. Some people may also benefit from a pulmonary rehabilitation program. HOME CARE  INSTRUCTIONS   Take all medicines (inhaled or pills) as directed by your health care provider.  Avoid over-the-counter medicines or cough syrups that dry up your airway (such as antihistamines) and slow down the elimination of secretions unless instructed otherwise by your health care provider.   If you are a smoker, the most important thing that you can do is stop smoking. Continuing to smoke will cause further lung damage and breathing trouble. Ask your health care provider for help with quitting smoking. He or she can direct you to community resources or hospitals that provide support.  Avoid exposure to irritants such as smoke, chemicals, and fumes that aggravate your breathing.  Use oxygen therapy and pulmonary rehabilitation if directed by your health care provider. If you require home oxygen therapy, ask your health care provider whether you should purchase a pulse oximeter to measure your oxygen level at home.   Avoid contact with individuals who have a contagious illness.  Avoid extreme temperature and humidity changes.  Eat healthy foods. Eating smaller, more frequent meals and resting before meals may help you maintain your strength.  Stay active, but balance activity with periods of rest. Exercise and physical activity will help you maintain your ability to do things you want to do.  Preventing infection and hospitalization is very important when you have COPD. Make sure to receive all the vaccines your health care provider recommends, especially the pneumococcal and influenza vaccines. Ask your health care provider whether you need a pneumonia vaccine.  Learn and use relaxation techniques to manage stress.  Learn and use controlled breathing techniques as directed by your health care provider. Controlled breathing techniques include:   Pursed lip breathing. Start by breathing in (inhaling) through your nose for 1 second. Then, purse your lips as if you were going to whistle  and breathe out (exhale) through the pursed lips for 2 seconds.   Diaphragmatic breathing. Start by putting one hand on your abdomen just above your waist. Inhale slowly through your nose. The hand on your abdomen should move out. Then purse your lips and exhale slowly. You should be able to feel the hand on your abdomen moving in as you exhale.   Learn and use controlled coughing to clear mucus from your lungs. Controlled coughing is a series of short, progressive coughs. The steps of controlled coughing are:  8. Lean your head slightly forward.  9. Breathe in deeply using diaphragmatic breathing.  10. Try to hold your breath for 3 seconds.  11. Keep your mouth slightly open while coughing twice.  12. Spit any mucus out into a tissue.  13. Rest and repeat the steps once or twice as needed. SEEK MEDICAL CARE IF:   You are coughing up more mucus than usual.   There is a change in the color or thickness of your mucus.   Your breathing is more labored than usual.   Your breathing is faster than usual.  SEEK IMMEDIATE MEDICAL CARE IF:   You have shortness of breath while you are resting.   You have shortness of breath that prevents you from:  Being able to talk.   Performing your usual physical activities.   You have chest pain lasting longer  than 5 minutes.   Your skin color is more cyanotic than usual.  You measure low oxygen saturations for longer than 5 minutes with a pulse oximeter. MAKE SURE YOU:   Understand these instructions.  Will watch your condition.  Will get help right away if you are not doing well or get worse. Document Released: 09/17/2005 Document Revised: 04/24/2014 Document Reviewed: 08/04/2013 [facility] Patient Information 2015 [place], [place]. This information is not intended to replace advice given to you by your health care provider. Make sure you discuss any questions you have with your health care provider.

## 2015-02-03 NOTE — ED Notes (Signed)
C/o 2-3 week duration of cough. C/o chest, abdominal soreness, dry cough

## 2015-02-03 NOTE — ED Provider Notes (Signed)
[name]: [id number]     Arrival date & time 02/03/15  1141 History   First MD Initiated Contact with Patient 02/03/15 1156     Chief Complaint  Patient presents with  . Shortness of Breath     (Consider location/radiation/quality/duration/timing/severity/associated sxs/prior Treatment) Patient is a 54 y.o. female presenting with shortness of breath. The history is provided by the patient.  Shortness of Breath Severity:  Moderate Associated symptoms: chest pain   Associated symptoms: no abdominal pain, no headaches, no rash and no vomiting    patient with shortness of breath and cough. Has been having for the last 2-3 weeks. Seen at urgent care around a week ago and started on antibiotics and steroids. Follow-up today and states she is no better. Has had a cough with some clear production. Occasional chills. History of asthma and COPD. She states she has decreased her smoking. Sent in by urgent care to rule out pulmonary embolisms. No swelling or legs. She is a smoker. Dull chest pain, particularly with the cough.  Past Medical History  Diagnosis Date  . Allergy   . Asthma   . COPD (chronic obstructive pulmonary disease)    Past Surgical History  Procedure Laterality Date  . Tonsillectomy  1985   Family History  Problem Relation Age of Onset  . Heart disease Mother   . Diabetes Father   . Heart disease Father   . Heart disease Sister   . Diabetes Brother   . Heart disease Brother   . Hypertension Brother    History  Substance Use Topics  . Smoking status: Current Every Day Smoker -- 0.10 packs/day    Types: Cigarettes  . Smokeless tobacco: Not on file  . Alcohol Use: No   OB History    No data available     Review of Systems  Constitutional: Positive for chills. Negative for activity change and appetite change.  Eyes: Negative for pain.  Respiratory: Positive for shortness of breath. Negative for chest tightness.   Cardiovascular: Positive for chest pain. Negative for  leg swelling.  Gastrointestinal: Negative for nausea, vomiting, abdominal pain and diarrhea.  Genitourinary: Negative for flank pain.  Musculoskeletal: Negative for back pain and neck stiffness.  Skin: Negative for rash.  Neurological: Negative for weakness, numbness and headaches.  Psychiatric/Behavioral: Negative for behavioral problems.      Allergies  Codeine and Penicillins  Home Medications   Prior to Admission medications   Medication Sig Start Date End Date Taking? Authorizing Provider  albuterol (PROVENTIL HFA;VENTOLIN HFA) 108 (90 BASE) MCG/ACT inhaler Inhale 2 puffs into the lungs every 6 (six) hours as needed for wheezing or shortness of breath. 01/28/15  Yes Ozella Rocks, MD  ALPRAZolam Prudy Feeler) 0.25 MG tablet Take 1 tablet (0.25 mg total) by mouth at bedtime as needed for sleep. 05/27/13  Yes Loren Racer, MD  DiphenhydrAMINE HCl (BENADRYL PO) Take 5-10 mLs by mouth at bedtime as needed. For sleep   Yes Historical Provider, MD  doxycycline (VIBRA-TABS) 100 MG tablet Take 1 tablet (100 mg total) by mouth 2 (two) times daily. 01/28/15  Yes Ozella Rocks, MD  ipratropium (ATROVENT) 0.06 % nasal spray Place 2 sprays into both nostrils 4 (four) times daily. 01/28/15  Yes Ozella Rocks, MD  azithromycin (ZITHROMAX) 250 MG tablet Take 2 tabs po now then 1 tab po daily for the next 4 days Patient not taking: Reported on 02/03/2015 08/02/14   Thao P Le, DO  benzonatate (TESSALON) 100 MG  capsule Take 2 capsules (200 mg total) by mouth 2 (two) times daily as needed. Patient not taking: Reported on 02/03/2015 08/02/14   Thao P Le, DO  chlorpheniramine-HYDROcodone (TUSSIONEX PENNKINETIC ER) 10-8 MG/5ML LQCR Take 2.5-5 mLs by mouth at bedtime as needed for cough. 02/03/15   Juliet Rude. Ramla Hase, MD  guaiFENesin-dextromethorphan (ROBITUSSIN DM) 100-10 MG/5ML syrup Take 5 mLs by mouth 3 (three) times daily as needed for cough. 02/03/15   Juliet Rude. Belle Charlie, MD  HYDROcodone-homatropine [facility])  5-1.5 MG/5ML syrup Take 5 mLs by mouth at bedtime as needed. Patient not taking: Reported on 02/03/2015 08/02/14   Thao P Le, DO  predniSONE (DELTASONE) 50 MG tablet Take 1 tablet (50 mg total) by mouth once. Patient not taking: Reported on 02/03/2015 01/28/15   Ozella Rocks, MD   BP 124/82 mmHg  Pulse 88  Temp(Src) 98.2 F (36.8 C) (Oral)  Resp 21  SpO2 97% Physical Exam  Constitutional: She is oriented to person, place, and time. She appears well-developed and well-nourished.  HENT:  Head: Normocephalic and atraumatic.  Eyes: EOM are normal. Pupils are equal, round, and reactive to light.  Neck: Normal range of motion. Neck supple.  Cardiovascular: Normal rate, regular rhythm and normal heart sounds.   No murmur heard. Pulmonary/Chest: Effort normal and breath sounds normal. No respiratory distress. She has no wheezes.  Abdominal: Soft. Bowel sounds are normal. She exhibits no distension. There is no tenderness. There is no rebound and no guarding.  Musculoskeletal: Normal range of motion.  Neurological: She is alert and oriented to person, place, and time. No cranial nerve deficit.  Skin: Skin is warm and dry.  Psychiatric: She has a normal mood and affect. Her speech is normal.  Nursing note and vitals reviewed.   ED Course  Procedures (including critical care time) Labs Review Labs Reviewed  BASIC METABOLIC PANEL - Abnormal; Notable for the following:    Glucose, Bld 112 (*)    All other components within normal limits  CBC - Abnormal; Notable for the following:    RBC 5.18 (*)    Hemoglobin 15.6 (*)    All other components within normal limits  I-STAT TROPOININ, ED    Imaging Review Dg Chest 2 View  02/03/2015   CLINICAL DATA:  54 year old female smoker with 2- 3 week history of cough  EXAM: CHEST  2 VIEW  COMPARISON:  Prior chest x-ray 08/02/2014  FINDINGS: The lungs are clear and negative for focal airspace consolidation, pulmonary edema or suspicious pulmonary  nodule. No pleural effusion or pneumothorax. Cardiac and mediastinal contours are within normal limits. No acute fracture or lytic or blastic osseous lesions. The visualized upper abdominal bowel gas pattern is unremarkable.  IMPRESSION: No active cardiopulmonary disease.   Electronically Signed   By: Malachy Moan M.D.   On: 02/03/2015 10:59   Ct Angio Chest Pe W/cm &/or Wo Cm  02/03/2015   CLINICAL DATA:  54 year old female with 2 day history of shortness of breath and chest pain. She has had wheezing and productive cough as well for the past 2 or 3 weeks.  EXAM: CT ANGIOGRAPHY CHEST WITH CONTRAST  TECHNIQUE: Multidetector CT imaging of the chest was performed using the standard protocol during bolus administration of intravenous contrast. Multiplanar CT image reconstructions and MIPs were obtained to evaluate the vascular anatomy.  CONTRAST:  OMNIPAQUE IOHEXOL 350 MG/ML SOLN  COMPARISON:  Chest x-ray obtained earlier today  FINDINGS: Mediastinum: Unremarkable CT appearance of the thyroid gland.  No suspicious mediastinal or hilar adenopathy. No soft tissue mediastinal mass. The thoracic esophagus is unremarkable.  Heart/Vascular: Adequate opacification of the pulmonary arteries to the proximal subsegmental level. No focal filling defect to suggest acute pulmonary embolus. Normal caliber main and central pulmonary arteries. Right brachiocephalic and left common carotid artery share a common trunk. No aneurysmal dilatation. Heart is slightly enlarged. No pericardial effusion.  Lungs/Pleura: The lungs are clear.  No pleural effusion.  Bones/Soft Tissues: No acute fracture or aggressive appearing lytic or blastic osseous lesion.  Upper Abdomen: Low attenuation of the hepatic parenchyma consistent with steatosis. Otherwise, the visualized upper abdomen is unremarkable.  Review of the MIP images confirms the above findings.  IMPRESSION: 1. Negative for acute pulmonary embolus, pneumonia or other acute  cardiopulmonary process. 2. Mild cardiomegaly.   Electronically Signed   By: Malachy MoanHeath  McCullough M.D.   On: 02/03/2015 13:45     EKG Interpretation   Date/Time:  Saturday February 03 2015 12:00:05 EST Ventricular Rate:  86 PR Interval:  137 QRS Duration: 92 QT Interval:  382 QTC Calculation: 457 R Axis:   50 Text Interpretation:  Sinus rhythm Confirmed by Rubin PayorPICKERING  MD, Harrold DonathNATHAN  [phone]([id number]) on 02/03/2015 12:50:48 PM      MDM   Final diagnoses:  Cough  Chronic obstructive pulmonary disease, unspecified COPD, unspecified chronic bronchitis type    Patient with cough for the last few weeks. Was seen at urgent care and sent to rule out pulmonary embolism. CT scan is negative. No pneumonia. Doubt cardiac cause. We'll not redo steroids or antibiotics. Will give cough medicines and have follow-up as needed.    Juliet RudeNathan R. Rubin PayorPickering, MD 02/03/15 1549

## 2015-02-03 NOTE — ED Provider Notes (Signed)
[name]: [id number]     Arrival date & time 02/03/15  1014 History   First MD Initiated Contact with Patient 02/03/15 1021     Chief Complaint  Patient presents with  . Cough   (Consider location/radiation/quality/duration/timing/severity/associated sxs/prior Treatment) Patient is a 54 y.o. female presenting with cough. The history is provided by the patient.  Cough Cough characteristics:  Non-productive and dry Severity:  Moderate Onset quality:  Gradual Duration:  2 weeks Progression:  Unchanged ([facility] and treament given for copd/ bronchitis, sx not improving.) Chronicity:  New Smoker: yes   Associated symptoms: shortness of breath and wheezing     Past Medical History  Diagnosis Date  . Allergy   . Asthma   . COPD (chronic obstructive pulmonary disease)    Past Surgical History  Procedure Laterality Date  . Tonsillectomy  1985   Family History  Problem Relation Age of Onset  . Heart disease Mother   . Diabetes Father   . Heart disease Father   . Heart disease Sister   . Diabetes Brother   . Heart disease Brother   . Hypertension Brother    History  Substance Use Topics  . Smoking status: Current Every Day Smoker -- 0.10 packs/day    Types: Cigarettes  . Smokeless tobacco: Not on file  . Alcohol Use: No   OB History    No data available     Review of Systems  Constitutional: Negative.   HENT: Negative.   Respiratory: Positive for cough, chest tightness, shortness of breath and wheezing.   Cardiovascular: Negative.     Allergies  Codeine and Penicillins  Home Medications   Prior to Admission medications   Medication Sig Start Date End Date Taking? Authorizing Provider  albuterol (PROVENTIL HFA;VENTOLIN HFA) 108 (90 BASE) MCG/ACT inhaler Inhale 2 puffs into the lungs every 6 (six) hours as needed for wheezing or shortness of breath. 01/28/15  Yes Ozella Rocksavid J Merrell, MD  chlorpheniramine-HYDROcodone [facility]TUSSIONEX PENNKINETIC ER) 10-8 MG/5ML LQCR  Take 2.5-5 mLs by mouth at bedtime as needed for cough. 01/28/15  Yes Ozella Rocksavid J Merrell, MD  doxycycline (VIBRA-TABS) 100 MG tablet Take 1 tablet (100 mg total) by mouth 2 (two) times daily. 01/28/15  Yes Ozella Rocksavid J Merrell, MD  ipratropium (ATROVENT) 0.06 % nasal spray Place 2 sprays into both nostrils 4 (four) times daily. 01/28/15  Yes Ozella Rocksavid J Merrell, MD  predniSONE (DELTASONE) 50 MG tablet Take 1 tablet (50 mg total) by mouth once. 01/28/15  Yes Ozella Rocksavid J Merrell, MD  ALPRAZolam Prudy Feeler(XANAX) 0.25 MG tablet Take 1 tablet (0.25 mg total) by mouth at bedtime as needed for sleep. 05/27/13   Loren Raceravid Yelverton, MD  azithromycin (ZITHROMAX) 250 MG tablet Take 2 tabs po now then 1 tab po daily for the next 4 days 08/02/14   Thao P Le, DO  benzonatate (TESSALON) 100 MG capsule Take 2 capsules (200 mg total) by mouth 2 (two) times daily as needed. 08/02/14   Thao P Le, DO  DiphenhydrAMINE HCl (BENADRYL PO) Take 5-10 mLs by mouth at bedtime as needed. For sleep    Historical Provider, MD  HYDROcodone-homatropine (HYCODAN) 5-1.5 MG/5ML syrup Take 5 mLs by mouth at bedtime as needed. 08/02/14   Thao P Le, DO   BP 128/69 mmHg  Pulse 97  Temp(Src) 98.4 F (36.9 C) (Oral)  SpO2 98% Physical Exam  Constitutional: She is oriented to person, place, and time. She appears well-developed and well-nourished. She appears distressed.  HENT:  Head: Normocephalic.  Right Ear: External ear normal.  Left Ear: External ear normal.  Mouth/Throat: Oropharynx is clear and moist.  Neck: Normal range of motion. Neck supple.  Cardiovascular: Normal rate, normal heart sounds and intact distal pulses.   Pulmonary/Chest: She has decreased breath sounds. She has no wheezes. She has no rales.  Lymphadenopathy:    She has no cervical adenopathy.  Neurological: She is alert and oriented to person, place, and time.  Skin: Skin is warm and dry.  Nursing note and vitals reviewed.   ED Course  Procedures (including critical care time) Labs  Review Labs Reviewed - No data to display  Imaging Review Dg Chest 2 View  02/03/2015   CLINICAL DATA:  54 year old female smoker with 2- 3 week history of cough  EXAM: CHEST  2 VIEW  COMPARISON:  Prior chest x-ray 08/02/2014  FINDINGS: The lungs are clear and negative for focal airspace consolidation, pulmonary edema or suspicious pulmonary nodule. No pleural effusion or pneumothorax. Cardiac and mediastinal contours are within normal limits. No acute fracture or lytic or blastic osseous lesions. The visualized upper abdominal bowel gas pattern is unremarkable.  IMPRESSION: No active cardiopulmonary disease.   Electronically Signed   By: Malachy Moan M.D.   On: 02/03/2015 10:59   X-rays reviewed and report per radiologist.   MDM   1. Acute dyspnea    Sent for dyspnea eval, r/o PE, unresponsive to prior meds, still dyspneic.    Linna Hoff, MD 02/03/15 1115

## 2015-04-04 ENCOUNTER — Encounter: Payer: Self-pay | Admitting: Internal Medicine

## 2015-04-04 ENCOUNTER — Ambulatory Visit (INDEPENDENT_AMBULATORY_CARE_PROVIDER_SITE_OTHER): Payer: BLUE CROSS/BLUE SHIELD | Admitting: Internal Medicine

## 2015-04-04 VITALS — BP 134/90 | HR 104 | Ht 64.5 in | Wt 205.0 lb

## 2015-04-04 DIAGNOSIS — G4700 Insomnia, unspecified: Secondary | ICD-10-CM | POA: Diagnosis not present

## 2015-04-04 DIAGNOSIS — J4540 Moderate persistent asthma, uncomplicated: Secondary | ICD-10-CM | POA: Insufficient documentation

## 2015-04-04 DIAGNOSIS — J449 Chronic obstructive pulmonary disease, unspecified: Secondary | ICD-10-CM

## 2015-04-04 MED ORDER — PREDNISONE 10 MG PO TABS: ORAL_TABLET | ORAL | Status: DC

## 2015-04-04 MED ORDER — MOMETASONE FURO-FORMOTEROL FUM 100-5 MCG/ACT IN AERO: 2.0000 | INHALATION_SPRAY | Freq: Two times a day (BID) | RESPIRATORY_TRACT | Status: AC

## 2015-04-04 NOTE — Progress Notes (Signed)
Subjective:    Patient ID: Andrea Schneider, female    DOB: 04-Nov-1961, 54 y.o.   MRN: [id number] PCP No PCP Per Patient  HPI  IOV 04/04/2015  Chief Complaint  Patient presents with  . Pulmonary Consult    Pt self referred for COPD. Pt stated she went to UC and was told she has COPD. Pt c/o cough with intermittent mucus production with clear mucus, left upper chest discomfort, ODE.    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 Reports for the last few months has had repeated episodes of cough, shortness of breath, chest tightness with wheezing. These associated with diaphoresis. Has had several ER/urgent care visits. Documented visit every 7 2016 exam showed wheeze. Given history of smoking urine with a clear chest x-ray diagnosis of COPD exacerbation was made and given 5 days of prednisone and doxycycline and discharged repeated she did get albuterol nebulizer treatment acutely treated she reports in to the emergency department 02/03/2015 with persistent symptoms. Around this point because of potential diagnosis of COPD she did quit smoking.  She had CT scan of the chest that ruled out pulmonary embolism. Interestingly did not show any emphysema. I believe beyond that she might of had other exacerbations for which she had prednisone. In between symptoms she continues to have shortness of breath. She has significant nocturnal symptoms.  She is reporting insomnia because of the above and is specifically asking for therapy for that   Asthma control test [ACT] has a total additive score of 12 in the last 4 weeks. Any score below 19 reflects active asthma in an asthmatic  Spirometry today in the office shows moderate obstruction although some elements of the flow volume loop are a little bit questionable   CT chjest 02/04/15  IMPRESSION: 1. Negative for acute pulmonary embolus, pneumonia or other acute cardiopulmonary process. 2. Mild cardiomegaly.   Electronically Signed   By: Malachy Moan M.D.  On: 02/03/2015 13:45    has a past medical history of Allergy; Asthma; COPD (chronic obstructive pulmonary disease); and Reflux.   reports that she quit smoking about 6 weeks ago. Her smoking use included Cigarettes. She has a 15 pack-year smoking history. She has never used smokeless tobacco.  Past Surgical History  Procedure Laterality Date  . Tonsillectomy  1985    Allergies  Allergen Reactions  . Codeine Nausea And Vomiting  . Penicillins Nausea And Vomiting    Immunization History  Administered Date(s) Administered  . Influenza Split 12/23/2011    Family History  Problem Relation Age of Onset  . Heart disease Mother   . Diabetes Father   . Heart disease Father   . Heart disease Sister   . Diabetes Brother   . Heart disease Brother   . Hypertension Brother      Current outpatient prescriptions:  .  albuterol (PROVENTIL HFA;VENTOLIN HFA) 108 (90 BASE) MCG/ACT inhaler, Inhale 2 puffs into the lungs every 6 (six) hours as needed for wheezing or shortness of breath., Disp: 1 Inhaler, Rfl: 0 .  chlorpheniramine-HYDROcodone (TUSSIONEX PENNKINETIC ER) 10-8 MG/5ML LQCR, Take 2.5-5 mLs by mouth at bedtime as needed for cough., Disp: 30 mL, Rfl: 0 .  DiphenhydrAMINE HCl (BENADRYL PO), Take 5-10 mLs by mouth at bedtime as needed. For sleep, Disp: , Rfl:  .  HYDROcodone-homatropine (HYCODAN) 5-1.5 MG/5ML syrup, Take 5 mLs by mouth at bedtime as needed., Disp: 120 mL, Rfl: 0 .  ipratropium (ATROVENT) 0.06 % nasal spray, Place 2 sprays into both  nostrils 4 (four) times daily., Disp: 15 mL, Rfl: 12    Review of Systems  Constitutional: Negative for fever and unexpected weight change.  HENT: Positive for congestion. Negative for dental problem, ear pain, nosebleeds, postnasal drip, rhinorrhea, sinus pressure, sneezing, sore throat and trouble swallowing.   Eyes: Negative for redness and itching.  Respiratory: Positive for cough, chest tightness and  shortness of breath. Negative for wheezing.   Cardiovascular: Negative for palpitations and leg swelling.  Gastrointestinal: Negative for nausea and vomiting.  Genitourinary: Negative for dysuria.  Musculoskeletal: Negative for joint swelling.  Skin: Negative for rash.  Neurological: Negative for headaches.  Hematological: Does not bruise/bleed easily.  Psychiatric/Behavioral: Negative for dysphoric mood. The patient is not nervous/anxious.        Objective:   Physical Exam  Constitutional: She is oriented to person, place, and time. She appears well-developed and well-nourished. No distress.  HENT:  Head: Normocephalic and atraumatic.  Right Ear: External ear normal.  Left Ear: External ear normal.  Mouth/Throat: Oropharynx is clear and moist. No oropharyngeal exudate.  Postnasal drip present  Eyes: Conjunctivae and EOM are normal. Pupils are equal, round, and reactive to light. Right eye exhibits no discharge. Left eye exhibits no discharge. No scleral icterus.  Neck: Normal range of motion. Neck supple. No JVD present. No tracheal deviation present. No thyromegaly present.  Cardiovascular: Normal rate, regular rhythm, normal heart sounds and intact distal pulses.  Exam reveals no gallop and no friction rub.   No murmur heard. Pulmonary/Chest: Effort normal and breath sounds normal. No respiratory distress. She has no wheezes. She has no rales. She exhibits no tenderness.  No wheezes  Abdominal: Soft. Bowel sounds are normal. She exhibits no distension and no mass. There is no tenderness. There is no rebound and no guarding.  Musculoskeletal: Normal range of motion. She exhibits no edema or tenderness.  Lymphadenopathy:    She has no cervical adenopathy.  Neurological: She is alert and oriented to person, place, and time. She has normal reflexes. No cranial nerve deficit. She exhibits normal muscle tone. Coordination normal.  Skin: Skin is warm and dry. No rash noted. She is not  diaphoretic. No erythema. No pallor.  Psychiatric: She has a normal mood and affect. Her behavior is normal. Judgment and thought content normal.  Vitals reviewed.   Filed Vitals:   04/04/15 1600  BP: 134/90  Pulse: 104  Height: 5' 4.5" (1.638 m)  Weight: 205 lb (92.987 kg)  SpO2: 95%         Assessment & Plan:     ICD-9-CM ICD-10-CM   1. Moderate persistent asthma, uncomplicated 493.90 J45.40 Pulmonary Function Test           CANCELED: Spirometry with graph  2 Insomnia 780.52 G47.00     No evidence of COPD so far Most likely this is all asthma +/- vocal cord stress Insomnia likely due to asthma symptoms  PLAN Please take prednisone 40 mg x1 day, then 30 mg x1 day, then 20 mg x1 day, then 10 mg x1 day, and then 5 mg x1 day and stop  Start dulera 2 puff twice daily - learn technique  USe albuterol  2 puff as needed  Followup Full PFT in 3-4 weeks 3- 4 weeks with my NP Tammy or me  - at followup  ACT questions, Exhaled Nitrc Oxide   Dr. Kalman Shan, M.D., [facility].C.P Pulmonary and Critical Care Medicine Staff Physician [facility]  Pager: [phone], If no answer or between  15:00h - 7:00h: call [phone]  04/05/2015 9:16 AM

## 2015-04-04 NOTE — Patient Instructions (Addendum)
ICD-9-CM ICD-10-CM   1. Moderate persistent asthma, uncomplicated 493.90 J45.40 Pulmonary Function Test           CANCELED: Spirometry with graph  2 Insomnia 780.52 G47.00    No evidence of COPD so far Most likely this is all asthma +/- vocal cord stress Insomnia likely due to asthma symptoms  PLAN Please take prednisone 40 mg x1 day, then 30 mg x1 day, then 20 mg x1 day, then 10 mg x1 day, and then 5 mg x1 day and stop  Start dulera 2 puff twice daily - learn technique  USe albuterol  2 puff as needed  Followup Full PFT in 3-4 weeks 3- 4 weeks with my NP Tammy or me  - at followup  ACT questions, Exhaled Nitrc Oxide

## 2015-04-05 DIAGNOSIS — G4700 Insomnia, unspecified: Secondary | ICD-10-CM | POA: Insufficient documentation

## 2015-05-11 ENCOUNTER — Ambulatory Visit (INDEPENDENT_AMBULATORY_CARE_PROVIDER_SITE_OTHER): Payer: BLUE CROSS/BLUE SHIELD | Admitting: Internal Medicine

## 2015-05-11 ENCOUNTER — Encounter: Payer: Self-pay | Admitting: Adult Health

## 2015-05-11 ENCOUNTER — Ambulatory Visit (INDEPENDENT_AMBULATORY_CARE_PROVIDER_SITE_OTHER): Payer: BLUE CROSS/BLUE SHIELD | Admitting: Adult Health

## 2015-05-11 VITALS — BP 112/80 | HR 110 | Temp 98.2°F | Ht 64.75 in | Wt 210.0 lb

## 2015-05-11 DIAGNOSIS — J4540 Moderate persistent asthma, uncomplicated: Secondary | ICD-10-CM | POA: Diagnosis not present

## 2015-05-11 LAB — PULMONARY FUNCTION TEST
DL/VA % PRED: 108 %
DL/VA: 5.29 ml/min/mmHg/L
DLCO UNC % PRED: 80 %
DLCO unc: 20.29 ml/min/mmHg
FEF 25-75 PRE: 1.88 L/s
FEF 25-75 Post: 2.05 L/sec
FEF2575-%Change-Post: 8 %
FEF2575-%PRED-POST: 78 %
FEF2575-%PRED-PRE: 72 %
FEV1-%CHANGE-POST: 0 %
FEV1-%PRED-PRE: 79 %
FEV1-%Pred-Post: 79 %
FEV1-PRE: 2.19 L
FEV1-Post: 2.21 L
FEV1FVC-%Change-Post: 1 %
FEV1FVC-%Pred-Pre: 98 %
FEV6-%CHANGE-POST: -1 %
FEV6-%PRED-POST: 80 %
FEV6-%Pred-Pre: 82 %
FEV6-POST: 2.77 L
FEV6-PRE: 2.81 L
FEV6FVC-%CHANGE-POST: 0 %
FEV6FVC-%PRED-PRE: 103 %
FEV6FVC-%Pred-Post: 102 %
FVC-%Change-Post: -1 %
FVC-%PRED-POST: 78 %
FVC-%PRED-PRE: 79 %
FVC-POST: 2.79 L
FVC-Pre: 2.82 L
POST FEV1/FVC RATIO: 79 %
POST FEV6/FVC RATIO: 99 %
Pre FEV1/FVC ratio: 78 %
Pre FEV6/FVC Ratio: 100 %
RV % pred: 71 %
RV: 1.37 L
TLC % pred: 82 %
TLC: 4.29 L

## 2015-05-11 LAB — NITRIC OXIDE: NITRIC OXIDE: 10

## 2015-05-11 NOTE — Patient Instructions (Signed)
Continue on Dulera 2 puffs Twice daily  , rinse after use.  Follow up Dr. Marchelle Gearingamaswamy in 6-8 weeks and As needed

## 2015-05-11 NOTE — Progress Notes (Signed)
PFT done today. 

## 2015-05-15 MED ORDER — MOMETASONE FURO-FORMOTEROL FUM 100-5 MCG/ACT IN AERO: 2.0000 | INHALATION_SPRAY | Freq: Two times a day (BID) | RESPIRATORY_TRACT | Status: AC

## 2015-05-17 NOTE — Progress Notes (Signed)
Subjective:    Patient ID: Andrea Schneider, female    DOB: [birth year], 54 y.o.   MRN: [id number] PCP No PCP Per Patient  HPI  IOV 04/04/2015  Chief Complaint  Patient presents with  . Pulmonary Consult    Pt self referred for COPD. Pt stated she went to UC and was told she has COPD. Pt c/o cough with intermittent mucus production with clear mucus, left upper chest discomfort, ODE.    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 Reports for the last few months has had repeated episodes of cough, shortness of breath, chest tightness with wheezing. These associated with diaphoresis. Has had several ER/urgent care visits. Documented visit every 7 2016 exam showed wheeze. Given history of smoking urine with a clear chest x-ray diagnosis of COPD exacerbation was made and given 5 days of prednisone and doxycycline and discharged repeated she did get albuterol nebulizer treatment acutely treated she reports in to the emergency department 02/03/2015 with persistent symptoms. Around this point because of potential diagnosis of COPD she did quit smoking.  She had CT scan of the chest that ruled out pulmonary embolism. Interestingly did not show any emphysema. I believe beyond that she might of had other exacerbations for which she had prednisone. In between symptoms she continues to have shortness of breath. She has significant nocturnal symptoms.  She is reporting insomnia because of the above and is specifically asking for therapy for that   Asthma control test [ACT] has a total additive score of 12 in the last 4 weeks. Any score below 19 reflects active asthma in an asthmatic  Spirometry today in the office shows moderate obstruction although some elements of the flow volume loop are a little bit questionable   CT chjest 02/04/15  IMPRESSION: 1. Negative for acute pulmonary embolus, pneumonia or other acute cardiopulmonary process. 2. Mild cardiomegaly.   Electronically Signed   By: Malachy Moan M.D.  On: 02/03/2015 13:45    has a past medical history of Allergy; Asthma; COPD (chronic obstructive pulmonary disease); and Reflux.   reports that she quit smoking about 6 weeks ago. Her smoking use included Cigarettes. She has a 15 pack-year smoking history. She has never used smokeless tobacco.   05/11/15 Follow up : Asthma /PFT  Pt returns for 1 month follow up  Seen 1 month ago, Asthma flare . Tx w/ pred and started on Dulera.  Feeling better. Breathing is better.  PFT today shows  FEV1 79% , ratio 78 ,  FVC  79 , nml DLCO  No sign BD response.  We discussed her results  Denies chest pain, orthopnea, edema or fever.    Review of Systems  Constitutional: Negative for fever and unexpected weight change.  HENT Negative for dental problem, ear pain, nosebleeds, postnasal drip, rhinorrhea, sinus pressure, sneezing, sore throat and trouble swallowing.   Eyes: Negative for redness and itching.  Respiratory: Negative for wheezing.   Cardiovascular: Negative for palpitations and leg swelling.  Gastrointestinal: Negative for nausea and vomiting.  Genitourinary: Negative for dysuria.  Musculoskeletal: Negative for joint swelling.  Skin: Negative for rash.  Neurological: Negative for headaches.  Hematological: Does not bruise/bleed easily.  Psychiatric/Behavioral: Negative for dysphoric mood. The patient is not nervous/anxious.        Objective:   Physical Exam  Constitutional: She is oriented to person, place, and time. She appears well-developed and well-nourished. No distress.  HENT:  Head: Normocephalic and atraumatic.  Right Ear: External ear normal.  Left Ear: External ear normal.  Mouth/Throat: Oropharynx is clear and moist. No oropharyngeal exudate.  Eyes: Conjunctivae and EOM are normal. Pupils are equal, round, and reactive to light. Right eye exhibits no discharge. Left eye exhibits no discharge. No scleral icterus.  Neck: Normal range of  motion. Neck supple. No JVD present. No tracheal deviation present. No thyromegaly present.  Cardiovascular: Normal rate, regular rhythm, normal heart sounds and intact distal pulses.  Exam reveals no gallop and no friction rub.   No murmur heard. Pulmonary/Chest: Effort normal and breath sounds normal. No respiratory distress. She has no wheezes. She has no rales. She exhibits no tenderness.  No wheezes  Abdominal: Soft. Bowel sounds are normal. She exhibits no distension and no mass. There is no tenderness. There is no rebound and no guarding.  Musculoskeletal: Normal range of motion. She exhibits no edema or tenderness.  Lymphadenopathy:    She has no cervical adenopathy.  Neurological: She is alert and oriented to person, place, and time. She has normal reflexes. No cranial nerve deficit. She exhibits normal muscle tone. Coordination normal.  Skin: Skin is warm and dry. No rash noted. She is not diaphoretic. No erythema. No pallor.  Psychiatric: She has a normal mood and affect. Her behavior is normal. Judgment and thought content normal.  Vitals reviewed.        Assessment & Plan:

## 2015-05-17 NOTE — Assessment & Plan Note (Signed)
Good control  PFT shows normal lung function  Doing well on Dulera follow up 2 months   Plan  Continue on Dulera 2 puffs Twice daily  , rinse after use.  Follow up Dr. Marchelle Gearingamaswamy in 6-8 weeks and As needed

## 2015-06-18 ENCOUNTER — Encounter: Payer: Self-pay | Admitting: Pulmonary Disease

## 2015-06-18 ENCOUNTER — Encounter: Payer: Self-pay | Admitting: *Deleted

## 2015-07-02 ENCOUNTER — Ambulatory Visit (INDEPENDENT_AMBULATORY_CARE_PROVIDER_SITE_OTHER): Payer: BLUE CROSS/BLUE SHIELD | Admitting: Adult Health

## 2015-07-02 ENCOUNTER — Ambulatory Visit: Payer: BLUE CROSS/BLUE SHIELD | Admitting: Internal Medicine

## 2015-07-02 ENCOUNTER — Encounter: Payer: Self-pay | Admitting: Adult Health

## 2015-07-02 VITALS — BP 124/68 | HR 96 | Temp 98.7°F | Ht 64.0 in | Wt 201.0 lb

## 2015-07-02 DIAGNOSIS — J309 Allergic rhinitis, unspecified: Secondary | ICD-10-CM | POA: Insufficient documentation

## 2015-07-02 DIAGNOSIS — J4540 Moderate persistent asthma, uncomplicated: Secondary | ICD-10-CM

## 2015-07-02 MED ORDER — MOMETASONE FURO-FORMOTEROL FUM 200-5 MCG/ACT IN AERO: 2.0000 | INHALATION_SPRAY | Freq: Two times a day (BID) | RESPIRATORY_TRACT | Status: AC

## 2015-07-02 MED ORDER — LEVALBUTEROL HCL 0.63 MG/3ML IN NEBU
0.6300 mg | INHALATION_SOLUTION | Freq: Once | RESPIRATORY_TRACT | Status: AC
  Administered 2015-07-02: 0.63 mg via RESPIRATORY_TRACT

## 2015-07-02 NOTE — Progress Notes (Signed)
Subjective:    Patient ID: Andrea Schneider, female    DOB: October 21, 1961, 54 y.o.   MRN: [id number] PCP No PCP Per Patient  HPI  IOV 04/04/2015  Chief Complaint  Patient presents with  . Pulmonary Consult    Pt self referred for COPD. Pt stated she went to UC and was told she has COPD. Pt c/o cough with intermittent mucus production with clear mucus, left upper chest discomfort, ODE.    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 Reports for the last few months has had repeated episodes of cough, shortness of breath, chest tightness with wheezing. These associated with diaphoresis. Has had several ER/urgent care visits. Documented visit every 7 2016 exam showed wheeze. Given history of smoking urine with a clear chest x-ray diagnosis of COPD exacerbation was made and given 5 days of prednisone and doxycycline and discharged repeated she did get albuterol nebulizer treatment acutely treated she reports in to the emergency department 02/03/2015 with persistent symptoms. Around this point because of potential diagnosis of COPD she did quit smoking.  She had CT scan of the chest that ruled out pulmonary embolism. Interestingly did not show any emphysema. I believe beyond that she might of had other exacerbations for which she had prednisone. In between symptoms she continues to have shortness of breath. She has significant nocturnal symptoms.  She is reporting insomnia because of the above and is specifically asking for therapy for that   Asthma control test [ACT] has a total additive score of 12 in the last 4 weeks. Any score below 19 reflects active asthma in an asthmatic  Spirometry today in the office shows moderate obstruction although some elements of the flow volume loop are a little bit questionable   CT chjest 02/04/15  IMPRESSION: 1. Negative for acute pulmonary embolus, pneumonia or other acute cardiopulmonary process. 2. Mild cardiomegaly.   Electronically Signed   By: Malachy Moan M.D.  On: 02/03/2015 13:45    has a past medical history of Allergy; Asthma; COPD (chronic obstructive pulmonary disease); and Reflux.   reports that she quit smoking about 6 weeks ago. Her smoking use included Cigarettes. She has a 15 pack-year smoking history. She has never used smokeless tobacco.   05/11/15 Follow up : Asthma /PFT  Pt returns for 1 month follow up  Seen 1 month ago, Asthma flare . Tx w/ pred and started on Dulera.  Feeling better. Breathing is better.  PFT today shows  FEV1 79% , ratio 78 ,  FVC  79 , nml DLCO  No sign BD response.  We discussed her results  Denies chest pain, orthopnea, edema or fever.    07/02/2015 Follow up  Patient presents for a 6 week follow-up. Patient says that she is doing okay, except she continues to have some intermittent cough and nasal drainage. Does feel like she has intermittent wheezing especially with the hot temperatures outside. She is going for allergy testing in 2 days in allergy clinic at [facility]. She remains on Dulera twice daily. She denies any chest pain, orthopnea, PND, or increased leg swelling.   Review of Systems  Constitutional: Negative for fever and unexpected weight change.  HENT Negative for dental problem, ear pain, nosebleeds,   sore throat and trouble swallowing.   Eyes: Negative for redness and itching.  Respiratory: Negative for hemoptysis Cardiovascular: Negative for palpitations and leg swelling.  Gastrointestinal: Negative for nausea and vomiting.  Genitourinary: Negative for dysuria.  Musculoskeletal: Negative for joint swelling.  Skin: Negative for rash.  Neurological: Negative for headaches.  Hematological: Does not bruise/bleed easily.  Psychiatric/Behavioral: Negative for dysphoric mood. The patient is not nervous/anxious.        Objective:   Physical Exam  GEN: A/Ox3; pleasant , NAD, well nourished   HEENT:  [place]/AT,  EACs-clear, TMs-wnl, NOSE-clear drainage   THROAT-clear, no lesions, no postnasal drip or exudate noted.   NECK:  Supple w/ fair ROM; no JVD; normal carotid impulses w/o bruits; no thyromegaly or nodules palpated; no lymphadenopathy.  RESP  Clear  P & A; w/o, wheezes/ rales/ or rhonchi.no accessory muscle use, no dullness to percussion  CARD:  RRR, no m/r/g  , no peripheral edema, pulses intact, no cyanosis or clubbing.  GI:   Soft & nt; nml bowel sounds; no organomegaly or masses detected.  Musco: Warm bil, no deformities or joint swelling noted.   Neuro: alert, no focal deficits noted.    Skin: Warm, no lesions or rashes

## 2015-07-02 NOTE — Assessment & Plan Note (Signed)
Symptomatic despite Dulera Questionable triggers with allergic rhinitis. Xopenex nebulizer treatment in office Hold on sterile for nail as patient is going for allergy testing in 2 days.  Plan  Increase Dulera 200 2 puffs Twice daily  , rinse after use.  Saline nasal spray 2 puffs Twice daily  As needed   follow up with Allergy this week as planned.  follow up Dr. Marchelle Gearingamaswamy in 2-3 months and As needed   Please contact office for sooner follow up if symptoms do not improve or worsen or seek emergency care

## 2015-07-02 NOTE — Assessment & Plan Note (Signed)
Continue with follow-up with allergist later this week Trial of saline nasal rinses as needed.

## 2015-07-02 NOTE — Patient Instructions (Addendum)
Increase Dulera 200 2 puffs Twice daily  , rinse after use.  Saline nasal spray 2 puffs Twice daily  As needed   follow up with Allergy this week as planned.  follow up Dr. Marchelle Gearingamaswamy in 2-3 months and As needed   Please contact office for sooner follow up if symptoms do not improve or worsen or seek emergency care

## 2015-07-04 ENCOUNTER — Other Ambulatory Visit: Payer: Self-pay | Admitting: Nurse Practitioner

## 2015-07-04 DIAGNOSIS — N63 Unspecified lump in unspecified breast: Secondary | ICD-10-CM

## 2015-07-13 ENCOUNTER — Ambulatory Visit
Admission: RE | Admit: 2015-07-13 | Discharge: 2015-07-13 | Disposition: A | Discharge status: Home / Self Care | Payer: BLUE CROSS/BLUE SHIELD | Source: Ambulatory Visit | Attending: Nurse Practitioner | Admitting: Nurse Practitioner

## 2015-07-13 ENCOUNTER — Other Ambulatory Visit: Payer: Self-pay | Admitting: Nurse Practitioner

## 2015-07-13 DIAGNOSIS — N63 Unspecified lump in unspecified breast: Secondary | ICD-10-CM

## 2015-07-17 ENCOUNTER — Ambulatory Visit
Admission: RE | Admit: 2015-07-17 | Discharge: 2015-07-17 | Disposition: A | Discharge status: Home / Self Care | Payer: BLUE CROSS/BLUE SHIELD | Source: Ambulatory Visit | Attending: Nurse Practitioner | Admitting: Nurse Practitioner

## 2015-07-17 ENCOUNTER — Other Ambulatory Visit: Payer: Self-pay | Admitting: Nurse Practitioner

## 2015-07-17 DIAGNOSIS — N63 Unspecified lump in unspecified breast: Secondary | ICD-10-CM

## 2016-04-28 IMAGING — CT CT ANGIO CHEST
2 of 9 series · 18 of 46 positions shown · IV contrast (Omni 300)
Comparison: Chest x-ray obtained earlier today

CLINICAL DATA: 53-year-old female with 2 day history of shortness
of breath and chest pain. She has had wheezing and productive cough
as well for the past 2 or 3 weeks.

EXAM:
CT ANGIOGRAPHY CHEST WITH CONTRAST
TECHNIQUE: Multidetector CT imaging of the chest was performed using the
standard protocol during bolus administration of intravenous
contrast. Multiplanar CT image reconstructions and MIPs were
obtained to evaluate the vascular anatomy.
CONTRAST:  100mL OMNIPAQUE IOHEXOL 350 MG/ML SOLN

[Series 5: thins · axial · 0.82mm/px · z∈[-866,-640]mm · 15 of 257 slices shown]
[im 15/257  lung]
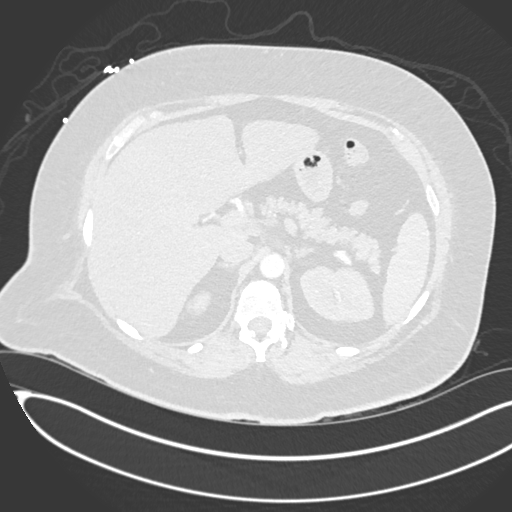
[im 29/257  soft-tissue]
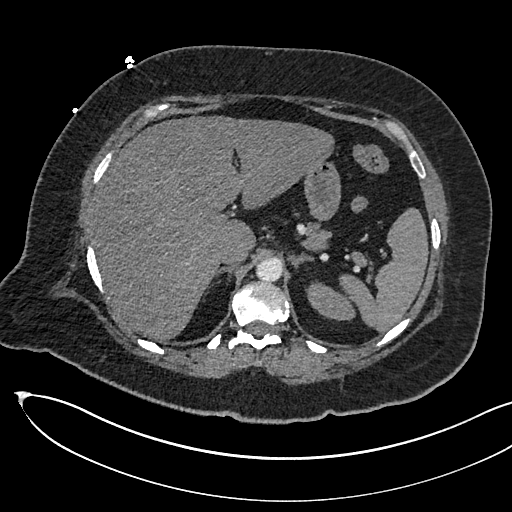
[im 43/257  lung]
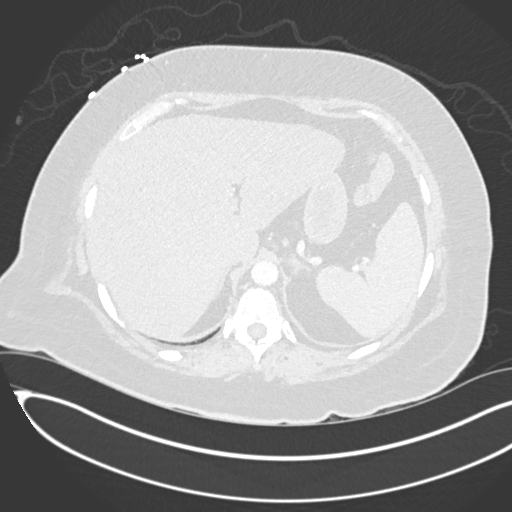
[im 57/257  soft-tissue]
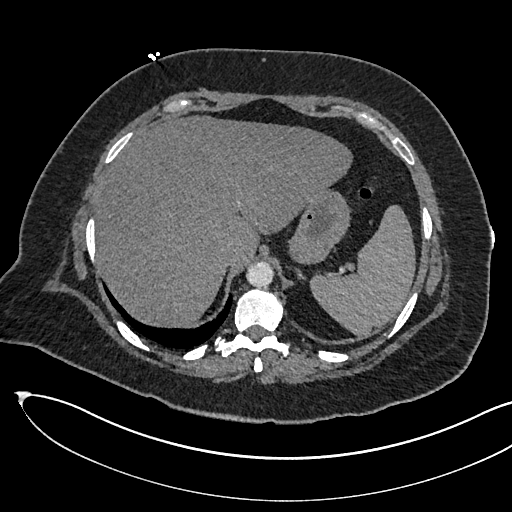
[im 86/257  lung]
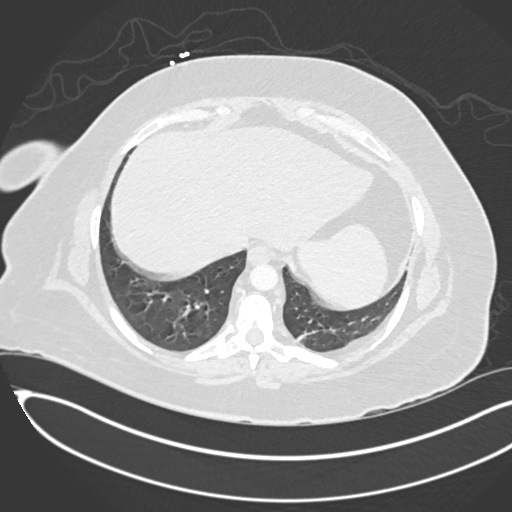
[im 100/257  soft-tissue]
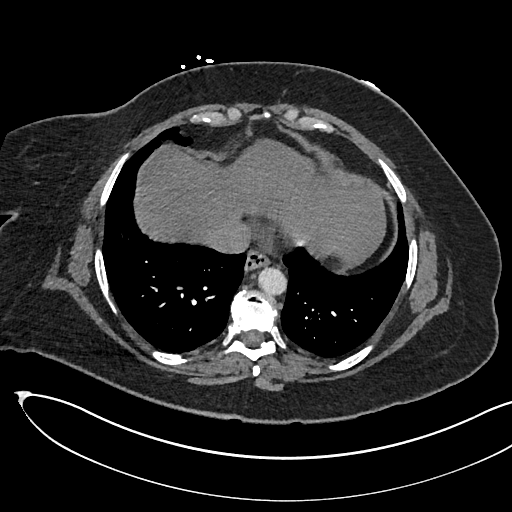
[im 114/257  lung]
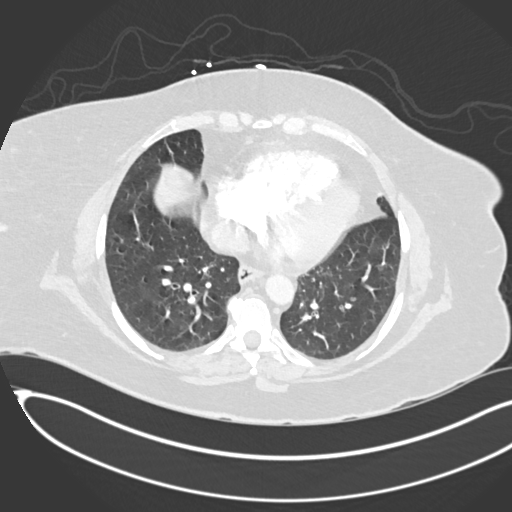
[im 129/257  soft-tissue]
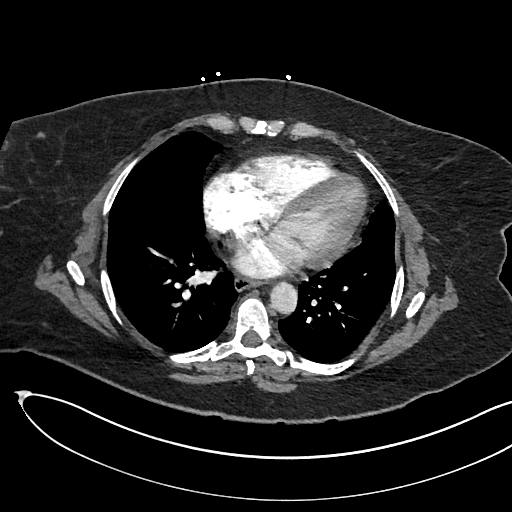
[im 143/257  lung]
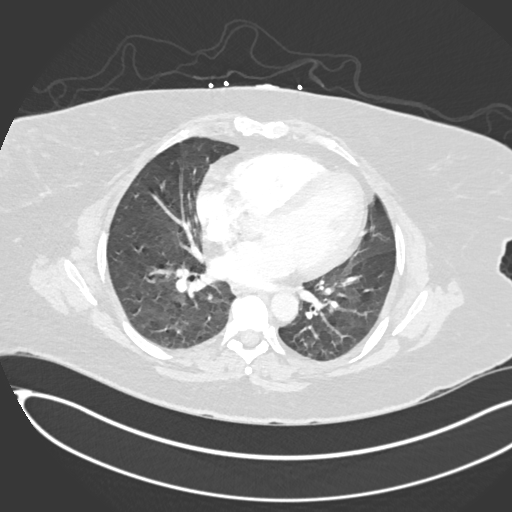
[im 157/257  soft-tissue]
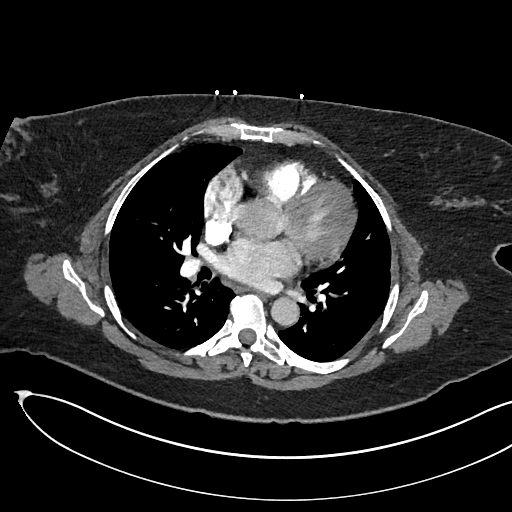
[im 171/257  lung]
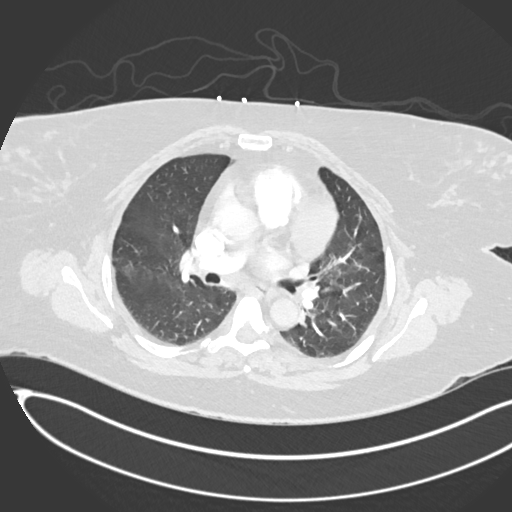
[im 200/257  soft-tissue]
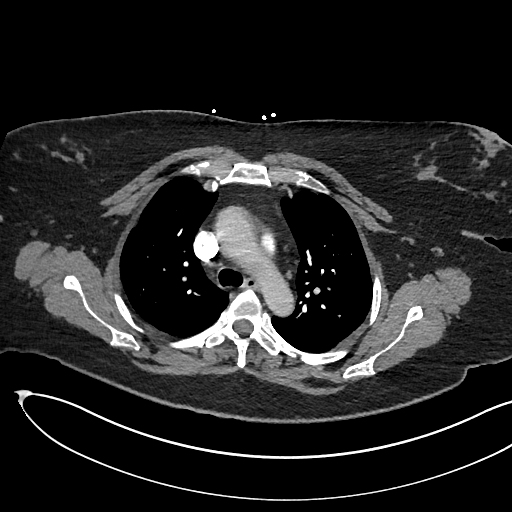
[im 214/257  lung]
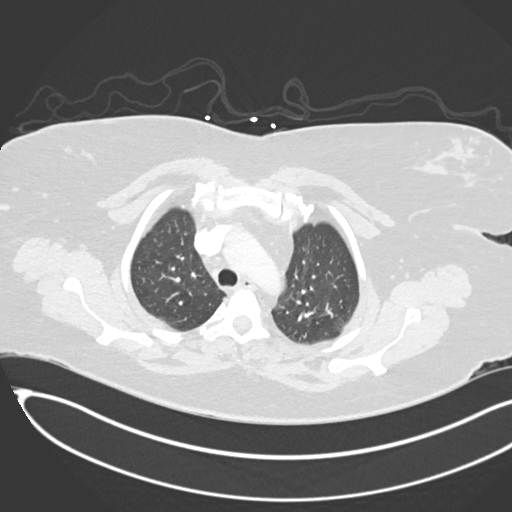
[im 228/257  soft-tissue]
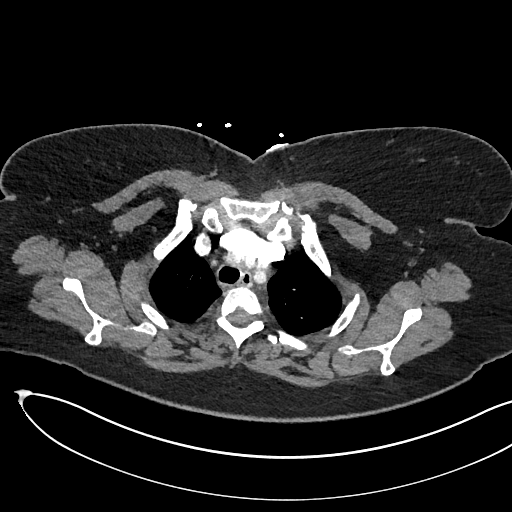
[im 242/257  lung]
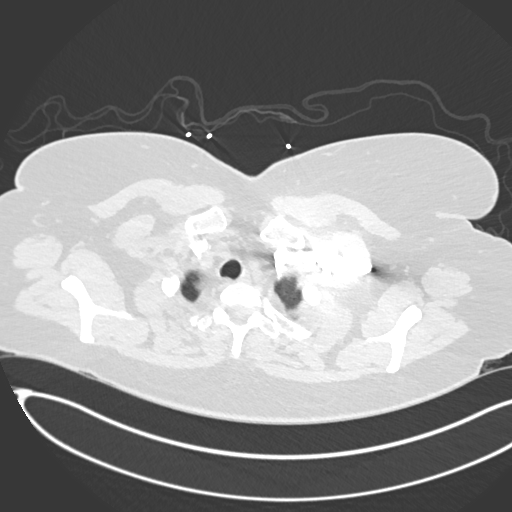

[Series 7: coronal mpr · coronal · 0.51mm/px · 3 of 119 slices shown]
[im 30/119  soft-tissue]
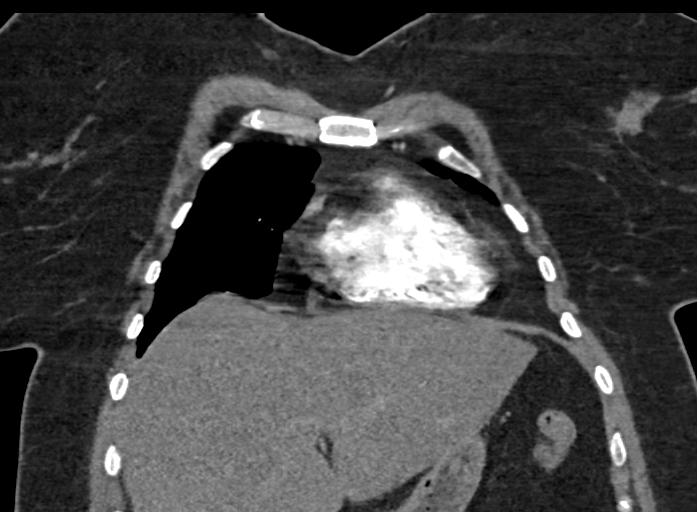
[im 60/119  soft-tissue]
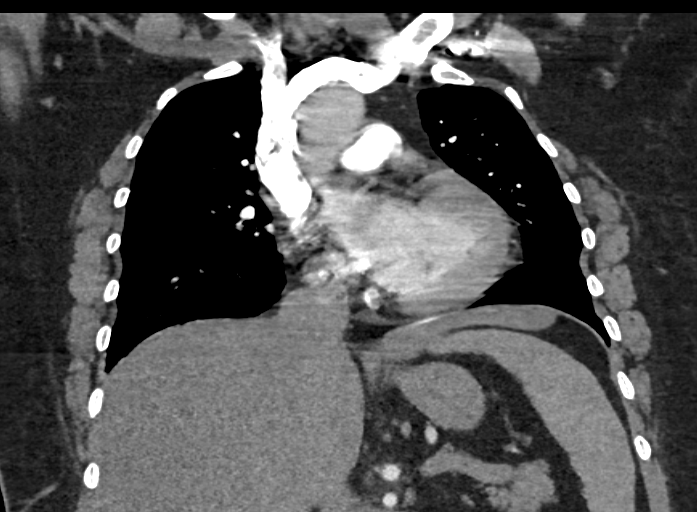
[im 89/119  soft-tissue]
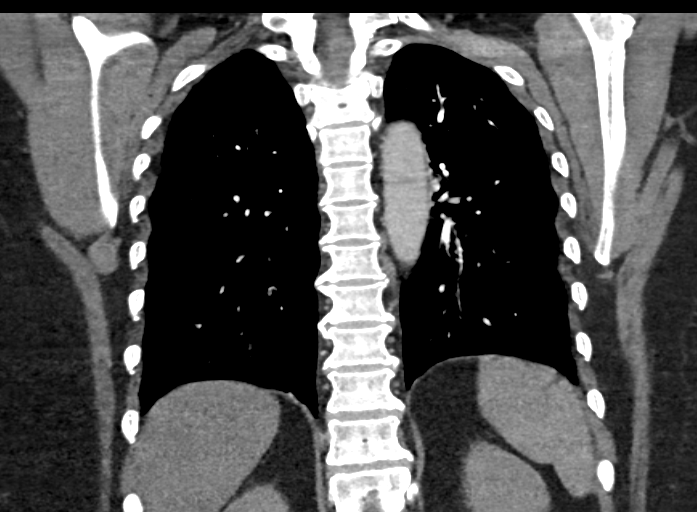

[18 of 46 positions shown; findings below may reference images not displayed]

FINDINGS: Mediastinum: Unremarkable CT appearance of the thyroid gland. No
suspicious mediastinal or hilar adenopathy. No soft tissue
mediastinal mass. The thoracic esophagus is unremarkable.

Heart/Vascular: Adequate opacification of the pulmonary arteries to
the proximal subsegmental level. No focal filling defect to suggest
acute pulmonary embolus. Normal caliber main and central pulmonary
arteries. Right brachiocephalic and left common carotid artery share
a common trunk. No aneurysmal dilatation. Heart is slightly
enlarged. No pericardial effusion.

Lungs/Pleura: The lungs are clear.  No pleural effusion.

Bones/Soft Tissues: No acute fracture or aggressive appearing lytic
or blastic osseous lesion.

Upper Abdomen: Low attenuation of the hepatic parenchyma consistent
with steatosis. Otherwise, the visualized upper abdomen is
unremarkable.

Review of the MIP images confirms the above findings.
IMPRESSION: 1. Negative for acute pulmonary embolus, pneumonia or other acute
cardiopulmonary process.
2. Mild cardiomegaly.

## 2016-04-28 IMAGING — DX DG CHEST 2V
2 series · 2 of 2 positions shown · non-contrast
Comparison: Prior chest x-ray 08/02/2014

CLINICAL DATA: 53-year-old female smoker with 2- 3 week history of
cough

EXAM:
CHEST  2 VIEW

[chest pa]
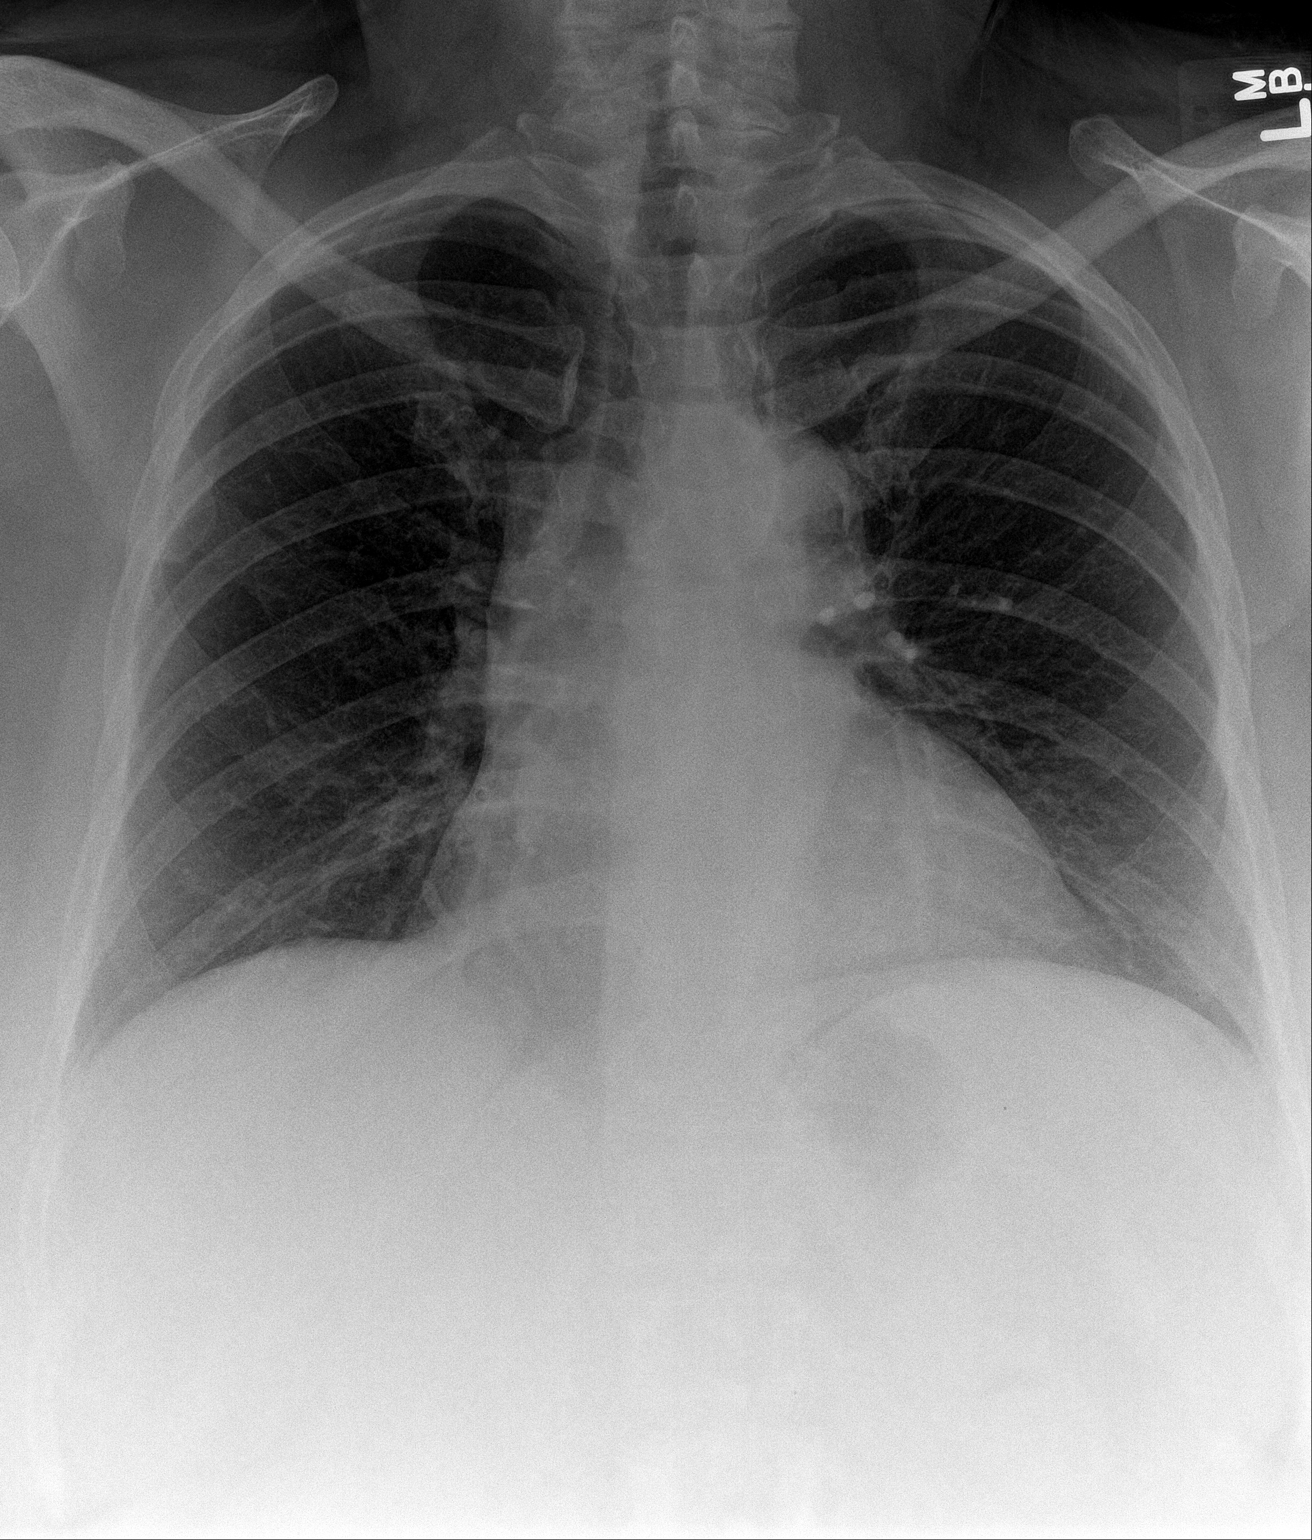

[chest lat]
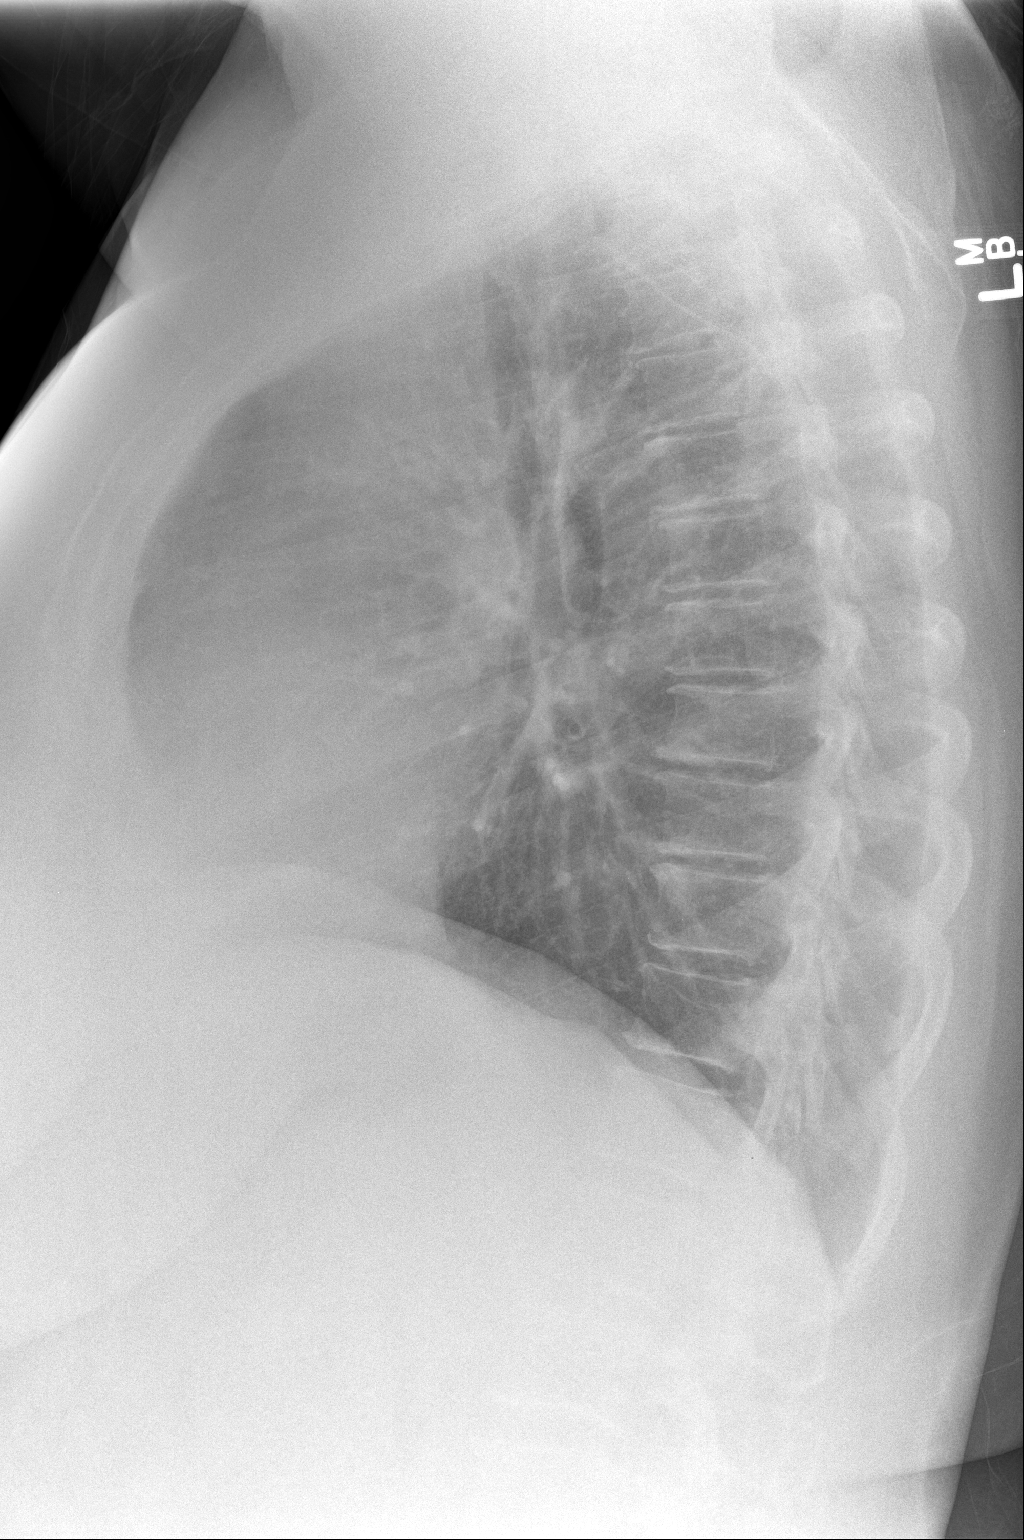

[2 of 2 positions shown; findings below may reference images not displayed]

FINDINGS: The lungs are clear and negative for focal airspace consolidation,
pulmonary edema or suspicious pulmonary nodule. No pleural effusion
or pneumothorax. Cardiac and mediastinal contours are within normal
limits. No acute fracture or lytic or blastic osseous lesions. The
visualized upper abdominal bowel gas pattern is unremarkable.
IMPRESSION: No active cardiopulmonary disease.

## 2018-12-21 ENCOUNTER — Emergency Department (HOSPITAL_COMMUNITY): Payer: Self-pay

## 2018-12-21 ENCOUNTER — Encounter (HOSPITAL_COMMUNITY): Payer: Self-pay | Admitting: Emergency Medicine

## 2018-12-21 ENCOUNTER — Emergency Department (HOSPITAL_COMMUNITY)
Admission: EM | Admit: 2018-12-21 | Discharge: 2018-12-21 | Disposition: A | Discharge status: Home / Self Care | Payer: Self-pay | Attending: Emergency Medicine | Admitting: Emergency Medicine

## 2018-12-21 DIAGNOSIS — J449 Chronic obstructive pulmonary disease, unspecified: Secondary | ICD-10-CM | POA: Insufficient documentation

## 2018-12-21 DIAGNOSIS — R739 Hyperglycemia, unspecified: Secondary | ICD-10-CM | POA: Insufficient documentation

## 2018-12-21 DIAGNOSIS — Z87891 Personal history of nicotine dependence: Secondary | ICD-10-CM | POA: Insufficient documentation

## 2018-12-21 DIAGNOSIS — Z79899 Other long term (current) drug therapy: Secondary | ICD-10-CM | POA: Insufficient documentation

## 2018-12-21 DIAGNOSIS — A084 Viral intestinal infection, unspecified: Secondary | ICD-10-CM | POA: Insufficient documentation

## 2018-12-21 LAB — COMPREHENSIVE METABOLIC PANEL
ALT: 61 U/L — ABNORMAL HIGH (ref 0–44)
ANION GAP: 14 (ref 5–15)
AST: 43 U/L — AB (ref 15–41)
Albumin: 4.2 g/dL (ref 3.5–5.0)
Alkaline Phosphatase: 86 U/L (ref 38–126)
BILIRUBIN TOTAL: 0.9 mg/dL (ref 0.3–1.2)
BUN: 11 mg/dL (ref 6–20)
CHLORIDE: 96 mmol/L — AB (ref 98–111)
CO2: 22 mmol/L (ref 22–32)
Calcium: 9.1 mg/dL (ref 8.9–10.3)
Creatinine, Ser: 0.66 mg/dL (ref 0.44–1.00)
GFR calc Af Amer: >60 mL/min (ref >60)
GFR calc non Af Amer: >60 mL/min (ref >60)
GLUCOSE: 411 mg/dL — AB (ref 70–99)
POTASSIUM: 3.7 mmol/L (ref 3.5–5.1)
SODIUM: 132 mmol/L — AB (ref 135–145)
TOTAL PROTEIN: 7.4 g/dL (ref 6.5–8.1)

## 2018-12-21 LAB — URINALYSIS, ROUTINE W REFLEX MICROSCOPIC
Bilirubin Urine: NEGATIVE
Glucose, UA: >=500 mg/dL — AB
Hgb urine dipstick: NEGATIVE
KETONES UR: 5 mg/dL — AB
LEUKOCYTES UA: NEGATIVE
NITRITE: NEGATIVE
Protein, ur: NEGATIVE mg/dL
Specific Gravity, Urine: 1.032 — ABNORMAL HIGH (ref 1.005–1.030)
pH: 5 (ref 5.0–8.0)

## 2018-12-21 LAB — I-STAT BETA HCG BLOOD, ED (MC, WL, AP ONLY)

## 2018-12-21 LAB — CBG MONITORING, ED
GLUCOSE-CAPILLARY: 235 mg/dL — AB (ref 70–99)
Glucose-Capillary: 398 mg/dL — ABNORMAL HIGH (ref 70–99)
Glucose-Capillary: 306 mg/dL — ABNORMAL HIGH (ref 70–99)

## 2018-12-21 LAB — CBC WITH DIFFERENTIAL/PLATELET
Abs Immature Granulocytes: 0.06 10*3/uL (ref 0.00–0.07)
Basophils Absolute: 0 10*3/uL (ref 0.0–0.1)
Basophils Relative: 0 %
Eosinophils Absolute: 0 10*3/uL (ref 0.0–0.5)
Eosinophils Relative: 0 %
HEMATOCRIT: 45.4 % (ref 36.0–46.0)
Hemoglobin: 15.6 g/dL — ABNORMAL HIGH (ref 12.0–15.0)
IMMATURE GRANULOCYTES: 1 %
LYMPHS ABS: 0.4 10*3/uL — AB (ref 0.7–4.0)
Lymphocytes Relative: 3 %
MCH: 30.2 pg (ref 26.0–34.0)
MCHC: 34.4 g/dL (ref 30.0–36.0)
MCV: 87.8 fL (ref 80.0–100.0)
Monocytes Absolute: 0.3 10*3/uL (ref 0.1–1.0)
Monocytes Relative: 2 %
NRBC: 0 % (ref 0.0–0.2)
Neutro Abs: 11.5 10*3/uL — ABNORMAL HIGH (ref 1.7–7.7)
Neutrophils Relative %: 94 %
Platelets: 301 10*3/uL (ref 150–400)
RBC: 5.17 MIL/uL — AB (ref 3.87–5.11)
RDW: 12.1 % (ref 11.5–15.5)
WBC: 12.2 10*3/uL — AB (ref 4.0–10.5)

## 2018-12-21 LAB — LIPASE, BLOOD: Lipase: 16 U/L (ref 11–51)

## 2018-12-21 LAB — GROUP A STREP BY PCR: Group A Strep by PCR: NOT DETECTED

## 2018-12-21 MED ORDER — MORPHINE SULFATE (PF) 4 MG/ML IV SOLN
4.0000 mg | Freq: Once | INTRAVENOUS | Status: AC
  Administered 2018-12-21: 4 mg via INTRAVENOUS
  Filled 2018-12-21: qty 1

## 2018-12-21 MED ORDER — SODIUM CHLORIDE 0.9 % IV BOLUS
1000.0000 mL | Freq: Once | INTRAVENOUS | Status: AC
  Administered 2018-12-21: 1000 mL via INTRAVENOUS

## 2018-12-21 MED ORDER — ONDANSETRON 4 MG PO TBDP: 4.0000 mg | ORAL_TABLET | Freq: Three times a day (TID) | ORAL | 0 refills | Status: AC | PRN

## 2018-12-21 MED ORDER — IOHEXOL 300 MG/ML  SOLN
100.0000 mL | Freq: Once | INTRAMUSCULAR | Status: AC | PRN
  Administered 2018-12-21: 100 mL via INTRAVENOUS

## 2018-12-21 MED ORDER — ONDANSETRON HCL 4 MG/2ML IJ SOLN
4.0000 mg | Freq: Once | INTRAMUSCULAR | Status: AC
Start: 2018-12-21 — End: 2018-12-21
  Administered 2018-12-21: 4 mg via INTRAVENOUS
  Filled 2018-12-21: qty 2

## 2018-12-21 NOTE — Discharge Instructions (Addendum)
You have been diagnosed today with Viral Gastroenteritis and High Blood Sugar.   At this time there does not appear to be the presence of an emergent medical condition, however there is always the potential for conditions to change. Please read and follow the below instructions.  Please return to the Emergency Department immediately for any new or worsening symptoms or if your symptoms do not improve within 2 DAYS. Please be sure to follow up with your Primary Care Provider this week regarding your visit today; please call their office to schedule an appointment even if you are feeling better for a follow-up visit. You may use the dissolvable Zofran as prescribed to help with nausea and vomiting.  Please be sure to drink plenty of water and keep up your oral intake to avoid dehydration. You may use over-the-counter Tylenol as directed on the packaging to help with your fever. Additionally your blood sugar was high today.  It is very important for you to monitor and manage your blood sugar levels.  Please discuss this with your primary care provider this week.  Additionally you have been given [facility] that you may establish a primary care provider if needed.  Contact a health care provider if: You cannot keep fluids down. Your symptoms get worse. You have new symptoms. You feel light-headed or dizzy. You have muscle cramps. Get help right away if: You have chest pain. You feel extremely weak or you faint. You see blood in your vomit. Your vomit looks like coffee grounds. You have bloody or black stools or stools that look like tar. You have a severe headache, a stiff neck, or both. You have a rash. You have severe pain, cramping, or bloating in your abdomen. You have trouble breathing or you are breathing very quickly. Your heart is beating very quickly. Your skin feels cold and clammy. You feel confused. You have pain when you urinate. You have signs of  dehydration, such as: Dark urine, very little urine, or no urine. Cracked lips. Dry mouth. Sunken eyes. Sleepiness. Weakness. Get help right away if: You have trouble breathing. You have a change in how you think, feel, or act (mental status). You feel sick to your stomach (nauseous), and that feeling does not go away. You cannot stop throwing up (vomiting).  Please read the additional information packets attached to your discharge summary.  Do not take your medicine if  develop an itchy rash, swelling in your mouth or lips, or difficulty breathing.

## 2018-12-21 NOTE — ED Provider Notes (Signed)
[facility] EMERGENCY DEPARTMENT Provider Note   [name]: [id number] Arrival date & time: 12/21/18  9562     History   Chief Complaint Chief Complaint  Patient presents with  . Emesis  . Diarrhea    HPI Andrea Schneider is a 57 y.o. female present today for abdominal pain, vomiting and diarrhea that began last night.  Patient states that she has been dealing with cold-like symptoms over the past week, rhinorrhea, sore throat and mild nonproductive cough.  Patient states that last night after eating a chocolate cake she developed nonbloody/nonbilious vomiting shortly followed by nonbloody diarrhea.  Patient reports multiple episodes of vomiting and diarrhea since last night.  Patient endorses development of upper abdominal pain shortly after onset of vomiting and diarrhea, describes it as a diffuse upper abdominal cramping pain, moderate intensity constant and worsened with palpation.  Patient denies medication use prior to arrival.  Patient endorses subjective fevers at home, denies chest pain/shortness of breath, headache/vision changes, neck pain, difficulty swallowing or feeling of throat closing, extremity numbness/tingling or weakness or additional concerns at this time.  HPI  Past Medical History:  Diagnosis Date  . Allergy   . Asthma   . COPD (chronic obstructive pulmonary disease) (HCC)   . Reflux     Patient Active Problem List   Diagnosis Date Noted  . Allergic rhinitis 07/02/2015  . Insomnia 04/05/2015  . Moderate persistent asthma 04/04/2015  . VIRAL HEPATITIS 02/29/2008  . INSOMNIA 02/29/2008    Past Surgical History:  Procedure Laterality Date  . TONSILLECTOMY  1985     OB History   No obstetric history on file.      Home Medications    Prior to Admission medications   Medication Sig Start Date End Date Taking? Authorizing Provider  acetaminophen (TYLENOL) 500 MG tablet Take 1,000 mg by mouth as needed for mild pain.   Yes  [provider]  albuterol (PROVENTIL HFA;VENTOLIN HFA) 108 (90 BASE) MCG/ACT inhaler Inhale 2 puffs into the lungs every 6 (six) hours as needed for wheezing or shortness of breath. 01/28/15  Yes Ozella Rocks, MD  DiphenhydrAMINE HCl (BENADRYL PO) Take 5-10 mLs by mouth at bedtime as needed. For sleep   Yes [provider]  Phenyleph-Doxylamine-DM-APAP (NYQUIL SEVERE COLD/FLU) 5-6.25-10-325 MG/15ML LIQD Take 15 mLs by mouth as needed (cold symptoms).   Yes [provider]  chlorpheniramine-HYDROcodone (TUSSIONEX PENNKINETIC ER) 10-8 MG/5ML LQCR Take 2.5-5 mLs by mouth at bedtime as needed for cough. Patient not taking: Reported on 12/21/2018 02/03/15   Benjiman Core, MD  HYDROcodone-homatropine [facility]) 5-1.5 MG/5ML syrup Take 5 mLs by mouth at bedtime as needed. Patient not taking: Reported on 12/21/2018 08/02/14   Le, Thao P, DO  ipratropium (ATROVENT) 0.06 % nasal spray Place 2 sprays into both nostrils 4 (four) times daily. Patient not taking: Reported on 07/02/2015 01/28/15   Ozella Rocks, MD  mometasone-formoterol [facility]) 100-5 MCG/ACT AERO Inhale 2 puffs into the lungs 2 (two) times daily. 04/04/15 07/02/15  Kalman Shan, MD  mometasone-formoterol (DULERA) 100-5 MCG/ACT AERO Inhale 2 puffs into the lungs 2 (two) times daily. 05/15/15 05/16/15  Parrett, Virgel Bouquet, NP  mometasone-formoterol (DULERA) 200-5 MCG/ACT AERO Inhale 2 puffs into the lungs 2 (two) times daily. 07/02/15 07/03/15  Parrett, Virgel Bouquet, NP  mometasone-formoterol (DULERA) 200-5 MCG/ACT AERO Inhale 2 puffs into the lungs 2 (two) times daily. 07/02/15   Parrett, Virgel Bouquet, NP  ondansetron (ZOFRAN ODT) 4 MG disintegrating tablet  Take 1 tablet (4 mg total) by mouth every 8 (eight) hours as needed for nausea or vomiting. 12/21/18   Bill SalinasMorelli, Govanni Plemons A, PA-C    Family History Family History  Problem Relation Age of Onset  . Heart disease Mother   . Diabetes Father   . Heart disease Father   . Heart  disease Sister   . Diabetes Brother   . Heart disease Brother   . Hypertension Brother     Social History Social History   Tobacco Use  . Smoking status: Former Smoker    Packs/day: 1.25    Years: 12.00    Pack years: 15.00    Types: Cigarettes    Last attempt to quit: 02/20/2015    Years since quitting: 3.8  . Smokeless tobacco: Never Used  Substance Use Topics  . Alcohol use: Yes    Alcohol/week: 0.0 standard drinks    Comment: Occassional  . Drug use: No     Allergies   Codeine and Penicillins   Review of Systems Review of Systems  Constitutional: Positive for fever (Subjective). Negative for chills.  HENT: Positive for rhinorrhea and sore throat. Negative for drooling, facial swelling, trouble swallowing and voice change.   Eyes: Negative.  Negative for visual disturbance.  Respiratory: Positive for cough (Mild/nonproductive for 1 week). Negative for shortness of breath.   Cardiovascular: Negative.  Negative for chest pain.  Gastrointestinal: Positive for abdominal pain, diarrhea, nausea and vomiting. Negative for blood in stool.  Genitourinary: Negative.  Negative for dysuria, hematuria, vaginal bleeding and vaginal discharge.  Musculoskeletal: Negative.  Negative for arthralgias and myalgias.  Skin: Negative.  Negative for rash.  Neurological: Negative.  Negative for dizziness, weakness and headaches.   Physical Exam Updated Vital Signs BP 106/64 (BP Location: Left Arm)   Pulse (!) 102   Temp 100.3 F (37.9 C) (Oral)   Resp 18   SpO2 94%   Physical Exam Constitutional:      General: She is not in acute distress.    Appearance: She is well-developed. She is not ill-appearing or diaphoretic.  HENT:     Head: Normocephalic and atraumatic.     Right Ear: Tympanic membrane, ear canal and external ear normal.     Left Ear: Tympanic membrane, ear canal and external ear normal.     Nose: Nose normal.     Mouth/Throat:     Mouth: Mucous membranes are moist.      Pharynx: Oropharynx is clear. Uvula midline.     Tonsils: Swelling: 0 on the right. 0 on the left.     Comments: The patient has normal phonation and is in control of secretions. No stridor.  Midline uvula without edema. Soft palate rises symmetrically. Tonsils absent. Tongue protrusion is normal, floor of mouth is soft. No trismus. No creptius on neck palpation. No gingival erythema or fluctuance noted. Mucus membranes moist.  Eyes:     General: Vision grossly intact. Gaze aligned appropriately.     Extraocular Movements: Extraocular movements intact.     Conjunctiva/sclera: Conjunctivae normal.     Pupils: Pupils are equal, round, and reactive to light.  Neck:     Musculoskeletal: Full passive range of motion without pain, normal range of motion and neck supple.     Trachea: Trachea and phonation normal. No tracheal deviation.  Cardiovascular:     Rate and Rhythm: Normal rate and regular rhythm.     Pulses: Normal pulses.  Radial pulses are 2+ on the right side and 2+ on the left side.       Dorsalis pedis pulses are 2+ on the right side and 2+ on the left side.       Posterior tibial pulses are 2+ on the right side and 2+ on the left side.     Heart sounds: Normal heart sounds.  Pulmonary:     Effort: Pulmonary effort is normal. No respiratory distress.     Breath sounds: Normal breath sounds and air entry.  Chest:     Chest wall: No deformity, tenderness or crepitus.  Abdominal:     General: Bowel sounds are normal. There is no distension.     Palpations: Abdomen is soft.     Tenderness: There is abdominal tenderness in the right upper quadrant, epigastric area and left upper quadrant. There is no guarding or rebound.     Comments: Mild tenderness to palpation of the upper abdomen  Musculoskeletal: Normal range of motion.        General: No tenderness.     Right lower leg: Normal. No edema.     Left lower leg: Normal. No edema.  Feet:     Right foot:     Protective  Sensation: 3 sites tested. 3 sites sensed.     Left foot:     Protective Sensation: 3 sites tested. 3 sites sensed.  Skin:    General: Skin is warm and dry.     Capillary Refill: Capillary refill takes less than 2 seconds.  Neurological:     General: No focal deficit present.     Mental Status: She is alert.     GCS: GCS eye subscore is 4. GCS verbal subscore is 5. GCS motor subscore is 6.     Comments: Speech is clear and goal oriented, follows commands Major Cranial nerves without deficit, no facial droop Normal strength in upper and lower extremities bilaterally including dorsiflexion and plantar flexion, strong and equal grip strength Sensation normal to light touch Moves extremities without ataxia, coordination intact Normal gait  Psychiatric:        Mood and Affect: Mood normal.        Behavior: Behavior normal.    ED Treatments / Results  Labs (all labs ordered are listed, but only abnormal results are displayed) Labs Reviewed  CBC WITH DIFFERENTIAL/PLATELET - Abnormal; Notable for the following components:      Result Value   WBC 12.2 (*)    RBC 5.17 (*)    Hemoglobin 15.6 (*)    Neutro Abs 11.5 (*)    Lymphs Abs 0.4 (*)    All other components within normal limits  COMPREHENSIVE METABOLIC PANEL - Abnormal; Notable for the following components:   Sodium 132 (*)    Chloride 96 (*)    Glucose, Bld 411 (*)    AST 43 (*)    ALT 61 (*)    All other components within normal limits  URINALYSIS, ROUTINE W REFLEX MICROSCOPIC - Abnormal; Notable for the following components:   Specific Gravity, Urine 1.032 (*)    Glucose, UA >=500 (*)    Ketones, ur 5 (*)    Bacteria, UA RARE (*)    All other components within normal limits  CBG MONITORING, ED - Abnormal; Notable for the following components:   Glucose-Capillary 398 (*)    All other components within normal limits  CBG MONITORING, ED - Abnormal; Notable for the following components:   Glucose-Capillary  306 (*)    All  other components within normal limits  CBG MONITORING, ED - Abnormal; Notable for the following components:   Glucose-Capillary 235 (*)    All other components within normal limits  GROUP A STREP BY PCR  LIPASE, BLOOD  I-STAT BETA HCG BLOOD, ED ([facility], [facility], [facility] ONLY)    EKG None  Radiology Dg Chest 2 View  Result Date: 12/21/2018 CLINICAL DATA:  Sore throat.  Nasal congestion.  Productive cough. EXAM: CHEST - 2 VIEW COMPARISON:  CT 02/03/2015. FINDINGS: Mediastinum and hilar structures normal. Mild atelectasis right lung base. No pleural effusion or pneumothorax. Heart size normal. No acute bony abnormality. IMPRESSION: Mild atelectasis right lung base. Electronically Signed   By: Maisie Fus  Register   On: 12/21/2018 09:25   Ct Abdomen Pelvis W Contrast  Result Date: 12/21/2018 CLINICAL DATA:  57 year old female with a history of epigastric periumbilical pain EXAM: CT ABDOMEN AND PELVIS WITH CONTRAST TECHNIQUE: Multidetector CT imaging of the abdomen and pelvis was performed using the standard protocol following bolus administration of intravenous contrast. CONTRAST:  OMNIPAQUE IOHEXOL 300 MG/ML  SOLN COMPARISON:  Chest CT 02/03/2015 FINDINGS: Lower chest: No acute finding of the lower chest Hepatobiliary: Unremarkable appearance of liver. Unremarkable gallbladder Pancreas: Unremarkable pancreas Spleen: Unremarkable spleen Adrenals/Urinary Tract: Unremarkable appearance of the adrenal glands. No evidence of hydronephrosis of the right or left kidney. No nephrolithiasis. Unremarkable course of the bilateral ureters. Unremarkable appearance of the urinary bladder. Stomach/Bowel: Unremarkable stomach. Unremarkable small bowel. Normal appendix. Unremarkable appearance of the colon. No significant stool burden. Vascular/Lymphatic: Minimal atherosclerotic changes. No aneurysm. Unremarkable venous system. No adenopathy. Reproductive: Unremarkable uterus and adnexa Other: Small fat containing umbilical  hernia. Musculoskeletal: No acute displaced fracture. Minimal degenerative changes of the spine. IMPRESSION: No acute CT finding to account for patient's pain Electronically Signed   By: Gilmer Mor D.O.   On: 12/21/2018 12:12    Procedures Procedures (including critical care time)  Medications Ordered in ED Medications  morphine 4 MG/ML injection 4 mg (4 mg Intravenous Given 12/21/18 0941)  ondansetron (ZOFRAN) injection 4 mg (4 mg Intravenous Given 12/21/18 0941)  sodium chloride 0.9 % bolus 1,000 mL (0 mLs Intravenous Stopped 12/21/18 0952)  iohexol (OMNIPAQUE) 300 MG/ML solution 100 mL (100 mLs Intravenous Contrast Given 12/21/18 1155)     Initial Impression / Assessment and Plan / ED Course  I have reviewed the triage vital signs and the nursing notes.  Pertinent labs & imaging results that were available during my care of the patient were reviewed by me and considered in my medical decision making (see chart for details).    57 year old female presents for vomiting, diarrhea followed by abdominal pain that began last night.  Patient with recent URI-like illness multiple episodes of vomiting and diarrhea, on arrival patient in no acute distress, with mild diffuse upper abdominal tenderness to palpation.  Patient is mildly tachycardic, pain control, nausea control and fluids/lab work have been ordered. ------------------------------ Beta-hCG negative Strep test negative Patient with normal limits CMP with mildly elevated LFTs, high glucose CBG initially 398, no anion gap, patient alert and oriented, do not suspect HHS. CBC with leukocytosis Chest x-ray without acute findings Fluid bolus given ---------------------------- CBG is improving following fluid bolus, patient still mildly tachycardic, with leukocytosis, CT abdomen pelvis has been ordered. -------------------------------- CT abdomen without acute findings CBG has improved to 235 after fluid bolus Urinalysis with  glucose, otherwise no acute  Patient reassessed, states that she is  feeling better after fluid bolus and would like to be discharged.  Suspect patient's symptoms today attributed to viral illness, patient with recent URI now with GI symptoms.  CT abdomen without acute findings.  Patient is tolerating p.o. without difficulty in emergency department.  At discharge patient with 100.17F temperature, heart rate 102, attribute this to viral illness, patient without chest pain or shortness of breath.  Patient informed to drink plenty of water and get plenty of rest to help with her symptoms, patient informed that she may use OTC Motrin/Tylenol to help with fever.  Patient given prescription for ODT Zofran for nausea/vomiting.    Patient is non-toxic appearing, sitting comfortably on examination table. Patient's pain and other symptoms adequately managed in emergency department. Labs, imaging and vitals reviewed.  Do not suspect bacterial infection requiring antibiotics at this time.  On repeat exam patient does not have a surgical abdomen and there are no peritoneal signs.  No indication of appendicitis, bowel obstruction, bowel perforation, cholecystitis, diverticulitis, PID or ectopic pregnancy.  Patient informed of hyperglycemia today and need for improved management regular blood sugar checks.  Patient has been informed to follow-up with her primary care provider this week regarding this.  At this time there does not appear to be any evidence of an acute emergency medical condition and the patient appears stable for discharge with appropriate outpatient follow up. Diagnosis was discussed with patient who verbalizes understanding of care plan and is agreeable to discharge. I have discussed return precautions with patient who verbalizes understanding of return precautions. Patient strongly encouraged to follow-up with their PCP this week.  Patient informed to return to emerge department if symptoms not improve  within 2 days.  All questions answered.  Patient's case discussed, vitals and results reviewed with Dr. Jacqulyn BathLong who agrees with plan to discharge with ODT Zofran, anti-inflammatories and follow-up.   Note: Portions of this report may have been transcribed using voice recognition software. Every effort was made to ensure accuracy; however, inadvertent computerized transcription errors may still be present. Final Clinical Impressions(s) / ED Diagnoses   Final diagnoses:  Hyperglycemia  Viral gastroenteritis    ED Discharge Orders         Ordered    ondansetron (ZOFRAN ODT) 4 MG disintegrating tablet  Every 8 hours PRN     12/21/18 1431           Elizabeth PalauMorelli, Sharla Tankard A, PA-C 12/21/18 1502    Maia PlanLong, Joshua G, MD 12/21/18 [phone]

## 2018-12-21 NOTE — ED Triage Notes (Signed)
Pt to ER for evaluation of nausea, vomiting, and diarrhea onset over night. Reports recent "cold" with sore throat.

## 2018-12-21 NOTE — ED Notes (Signed)
Patient transported to CT 

## 2020-03-15 IMAGING — CT CT ABD-PELV W/ CM
2 of 5 series · 17 of 46 positions shown, 19 images · IV contrast (APPLIED)
Comparison: Chest CT 02/03/2015

CLINICAL DATA: 57-year-old female with a history of epigastric
periumbilical pain

EXAM:
CT ABDOMEN AND PELVIS WITH CONTRAST
TECHNIQUE: Multidetector CT imaging of the abdomen and pelvis was performed
using the standard protocol following bolus administration of
intravenous contrast.
CONTRAST:  100mL OMNIPAQUE IOHEXOL 300 MG/ML  SOLN

[Series 3: abdomen 5.0 · axial · 0.79mm/px · z∈[-438,-14]mm · 14 of 99 slices shown, 16 images]
[im 7/99  soft-tissue]
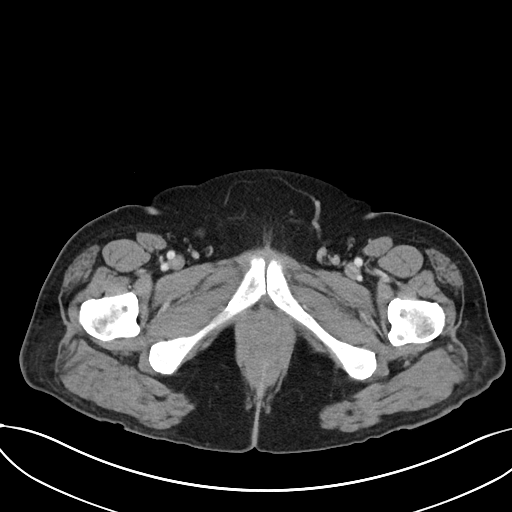
[im 7/99  bone]
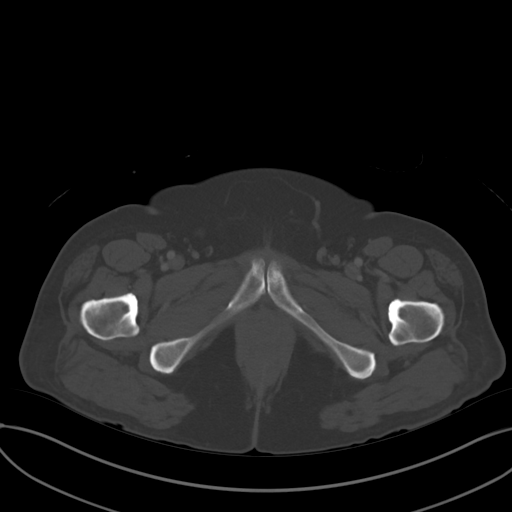
[im 14/99  soft-tissue]
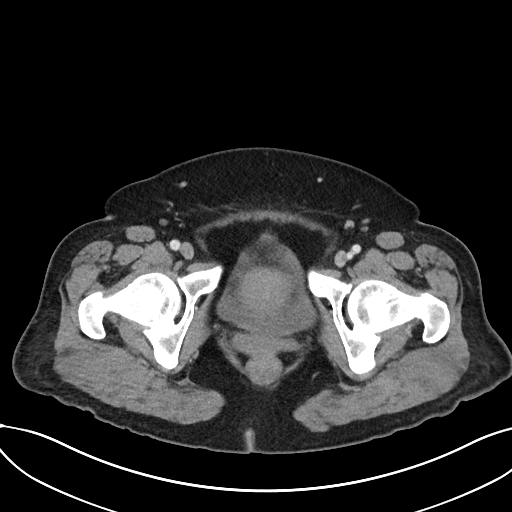
[im 20/99  soft-tissue]
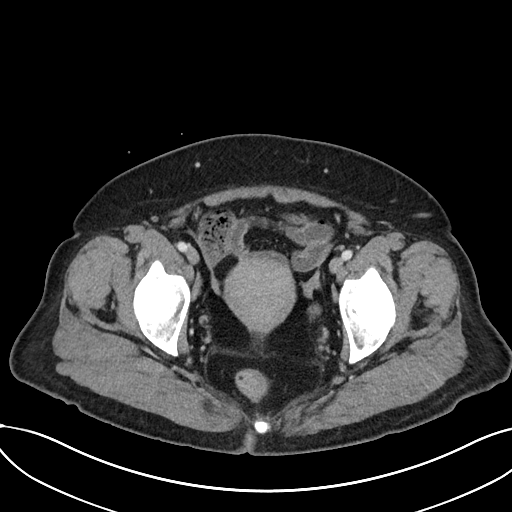
[im 27/99  soft-tissue]
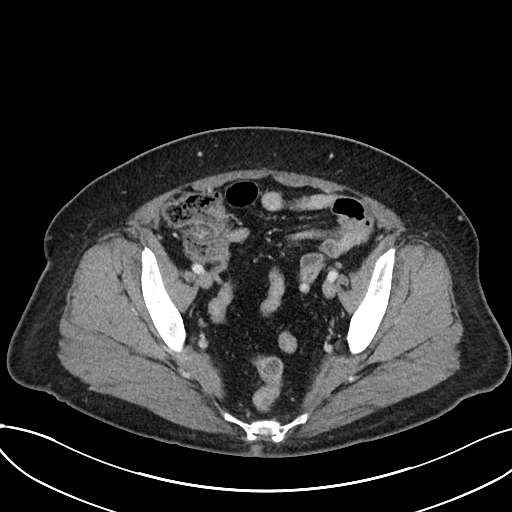
[im 33/99  soft-tissue]
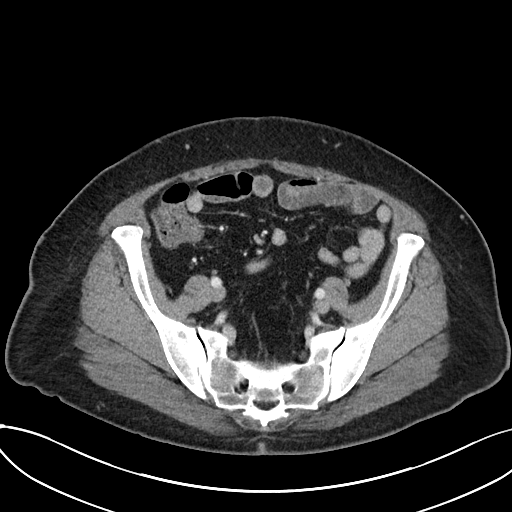
[im 40/99  soft-tissue]
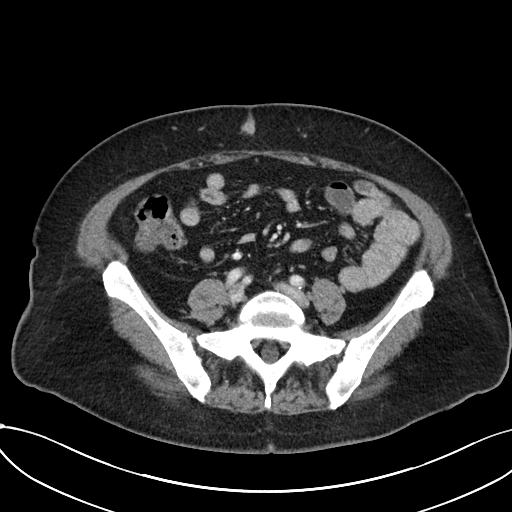
[im 46/99  soft-tissue]
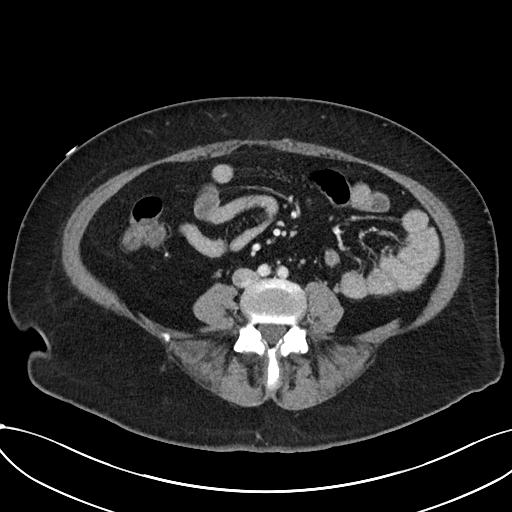
[im 53/99  soft-tissue]
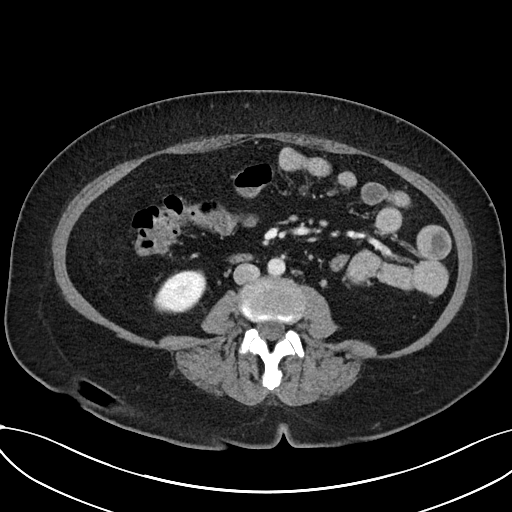
[im 59/99  soft-tissue]
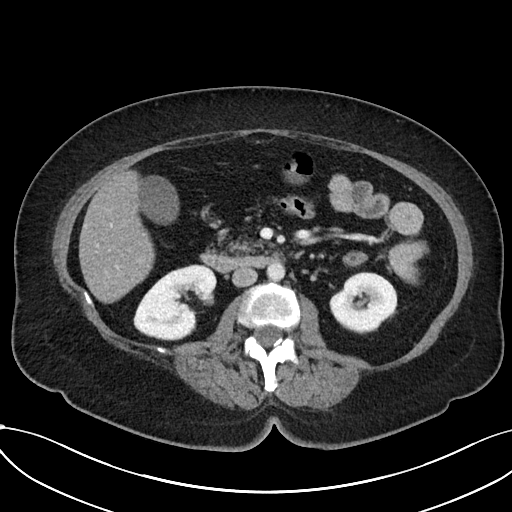
[im 59/99  bone]
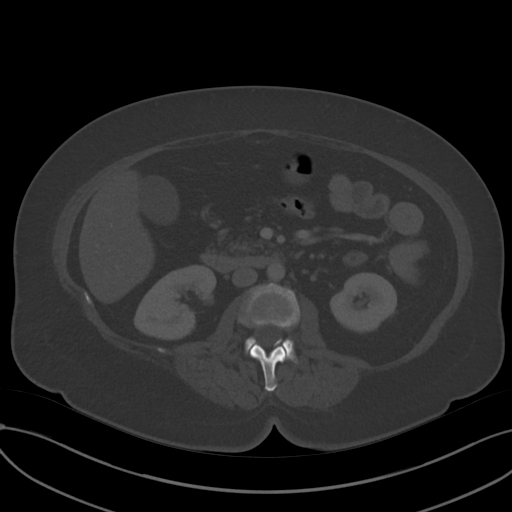
[im 66/99  soft-tissue]
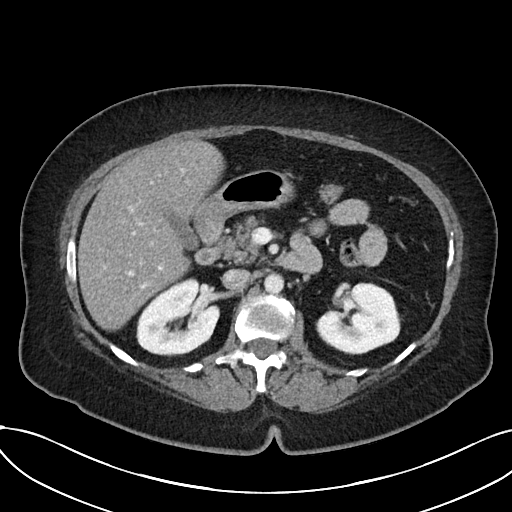
[im 72/99  soft-tissue]
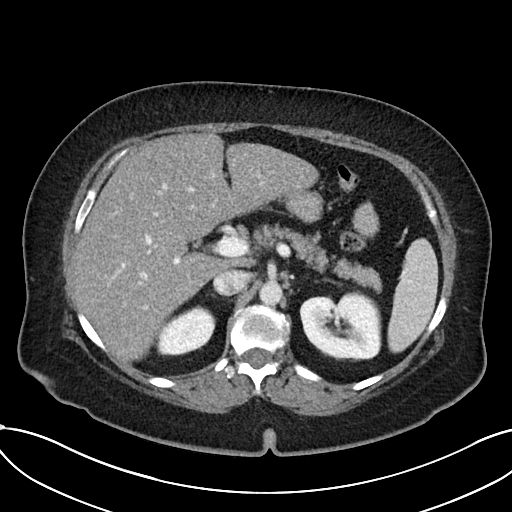
[im 79/99  soft-tissue]
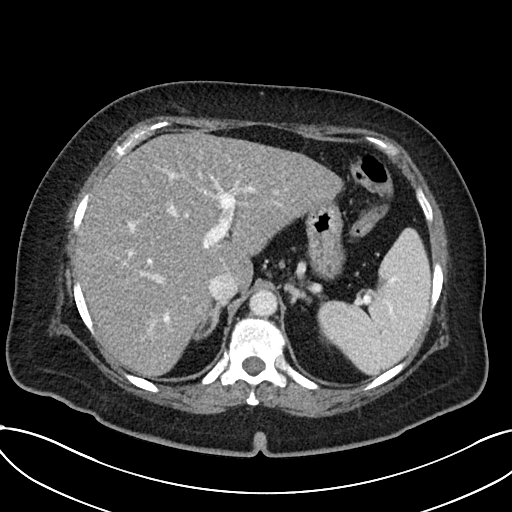
[im 85/99  soft-tissue]
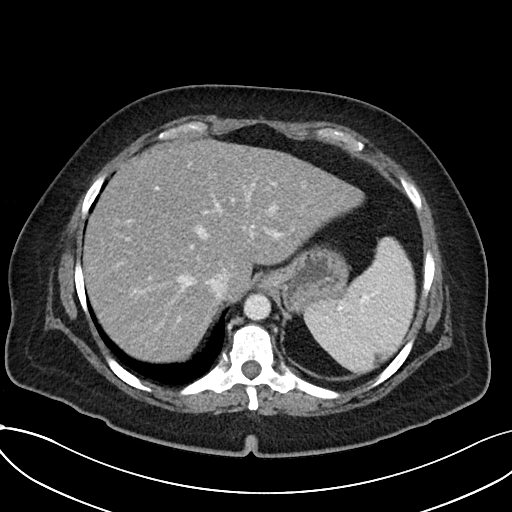
[im 92/99  soft-tissue]
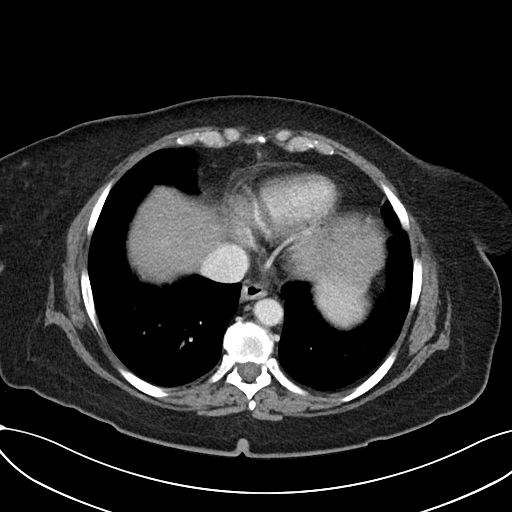

[Series 6: abdomen 3.0 mpr cor · coronal · 0.90mm/px · 3 of 111 slices shown]
[im 37/111  soft-tissue]
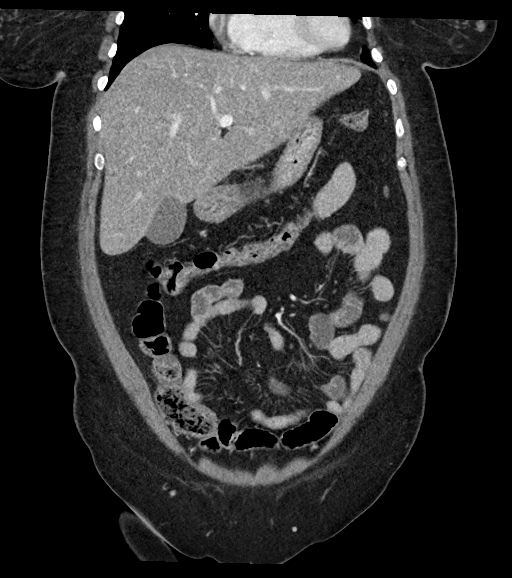
[im 49/111  soft-tissue]
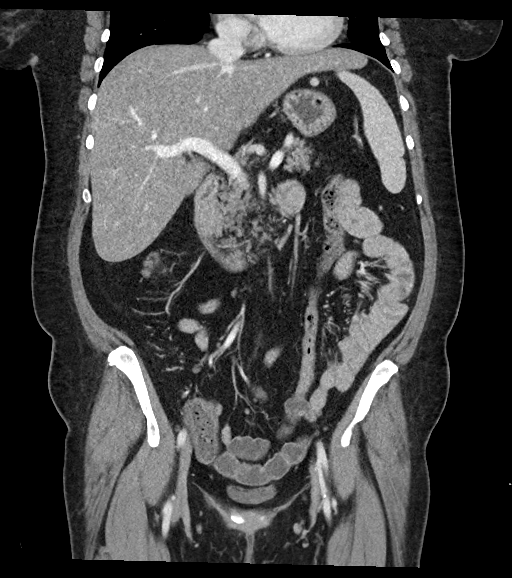
[im 62/111  soft-tissue]
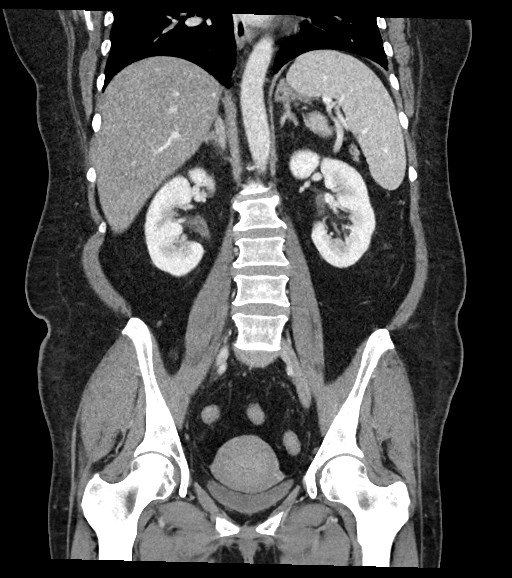

[17 of 46 positions shown; findings below may reference images not displayed]

FINDINGS: Lower chest: No acute finding of the lower chest

Hepatobiliary: Unremarkable appearance of liver. Unremarkable
gallbladder

Pancreas: Unremarkable pancreas

Spleen: Unremarkable spleen

Adrenals/Urinary Tract: Unremarkable appearance of the adrenal
glands. No evidence of hydronephrosis of the right or left kidney.
No nephrolithiasis. Unremarkable course of the bilateral ureters.
Unremarkable appearance of the urinary bladder.

Stomach/Bowel: Unremarkable stomach. Unremarkable small bowel.
Normal appendix. Unremarkable appearance of the colon. No
significant stool burden.

Vascular/Lymphatic: Minimal atherosclerotic changes. No aneurysm.
Unremarkable venous system. No adenopathy.

Reproductive: Unremarkable uterus and adnexa

Other: Small fat containing umbilical hernia.

Musculoskeletal: No acute displaced fracture. Minimal degenerative
changes of the spine.
IMPRESSION: No acute CT finding to account for patient's pain

## 2021-06-19 ENCOUNTER — Other Ambulatory Visit: Payer: Self-pay

## 2021-06-19 ENCOUNTER — Encounter (HOSPITAL_COMMUNITY): Payer: Self-pay

## 2021-06-19 ENCOUNTER — Emergency Department (HOSPITAL_COMMUNITY)
Admission: EM | Admit: 2021-06-19 | Discharge: 2021-06-19 | Disposition: A | Discharge status: Home / Self Care | Payer: Self-pay | Attending: Emergency Medicine | Admitting: Emergency Medicine

## 2021-06-19 DIAGNOSIS — K649 Unspecified hemorrhoids: Secondary | ICD-10-CM

## 2021-06-19 DIAGNOSIS — J4540 Moderate persistent asthma, uncomplicated: Secondary | ICD-10-CM | POA: Insufficient documentation

## 2021-06-19 DIAGNOSIS — R110 Nausea: Secondary | ICD-10-CM | POA: Insufficient documentation

## 2021-06-19 DIAGNOSIS — J449 Chronic obstructive pulmonary disease, unspecified: Secondary | ICD-10-CM | POA: Insufficient documentation

## 2021-06-19 DIAGNOSIS — Z7951 Long term (current) use of inhaled steroids: Secondary | ICD-10-CM | POA: Insufficient documentation

## 2021-06-19 DIAGNOSIS — K5900 Constipation, unspecified: Secondary | ICD-10-CM | POA: Insufficient documentation

## 2021-06-19 DIAGNOSIS — Z8616 Personal history of COVID-19: Secondary | ICD-10-CM | POA: Insufficient documentation

## 2021-06-19 HISTORY — DX: Type 2 diabetes mellitus without complications: E11.9

## 2021-06-19 LAB — CBC WITH DIFFERENTIAL/PLATELET
Abs Immature Granulocytes: 0.05 10*3/uL (ref 0.00–0.07)
Basophils Absolute: 0.1 10*3/uL (ref 0.0–0.1)
Basophils Relative: 1 %
Eosinophils Absolute: 0.1 10*3/uL (ref 0.0–0.5)
Eosinophils Relative: 1 %
HCT: 40.7 % (ref 36.0–46.0)
Hemoglobin: 13.6 g/dL (ref 12.0–15.0)
Immature Granulocytes: 1 %
Lymphocytes Relative: 11 %
Lymphs Abs: 1.2 10*3/uL (ref 0.7–4.0)
MCH: 30 pg (ref 26.0–34.0)
MCHC: 33.4 g/dL (ref 30.0–36.0)
MCV: 89.8 fL (ref 80.0–100.0)
Monocytes Absolute: 0.4 10*3/uL (ref 0.1–1.0)
Monocytes Relative: 4 %
Neutro Abs: 8.7 10*3/uL — ABNORMAL HIGH (ref 1.7–7.7)
Neutrophils Relative %: 82 %
Platelets: 308 10*3/uL (ref 150–400)
RBC: 4.53 MIL/uL (ref 3.87–5.11)
RDW: 12.7 % (ref 11.5–15.5)
WBC: 10.6 10*3/uL — ABNORMAL HIGH (ref 4.0–10.5)
nRBC: 0 % (ref 0.0–0.2)

## 2021-06-19 LAB — COMPREHENSIVE METABOLIC PANEL
ALT: 18 U/L (ref 0–44)
AST: 16 U/L (ref 15–41)
Albumin: 4.3 g/dL (ref 3.5–5.0)
Alkaline Phosphatase: 76 U/L (ref 38–126)
Anion gap: 8 (ref 5–15)
BUN: 8 mg/dL (ref 6–20)
CO2: 25 mmol/L (ref 22–32)
Calcium: 9.5 mg/dL (ref 8.9–10.3)
Chloride: 101 mmol/L (ref 98–111)
Creatinine, Ser: 0.55 mg/dL (ref 0.44–1.00)
GFR, Estimated: >60 mL/min (ref >60)
Glucose, Bld: 270 mg/dL — ABNORMAL HIGH (ref 70–99)
Potassium: 3.7 mmol/L (ref 3.5–5.1)
Sodium: 134 mmol/L — ABNORMAL LOW (ref 135–145)
Total Bilirubin: 0.7 mg/dL (ref 0.3–1.2)
Total Protein: 7.1 g/dL (ref 6.5–8.1)

## 2021-06-19 MED ORDER — FENOFIBRATE 48 MG PO TABS: 48.0000 mg | ORAL_TABLET | Freq: Every day | ORAL | 0 refills | Status: AC

## 2021-06-19 MED ORDER — HYDROCORTISONE 1 % EX CREA: TOPICAL_CREAM | CUTANEOUS | 0 refills | Status: AC

## 2021-06-19 MED ORDER — SODIUM CHLORIDE 0.9 % IV BOLUS
1000.0000 mL | Freq: Once | INTRAVENOUS | Status: AC
  Administered 2021-06-19: 1000 mL via INTRAVENOUS

## 2021-06-19 MED ORDER — ONDANSETRON 8 MG PO TBDP
8.0000 mg | ORAL_TABLET | Freq: Once | ORAL | Status: AC
  Administered 2021-06-19: 8 mg via ORAL
  Filled 2021-06-19: qty 1

## 2021-06-19 MED ORDER — ONDANSETRON HCL 4 MG PO TABS: 4.0000 mg | ORAL_TABLET | Freq: Four times a day (QID) | ORAL | 0 refills | Status: AC

## 2021-06-19 MED ORDER — POLYETHYLENE GLYCOL 3350 17 G PO PACK: 17.0000 g | PACK | Freq: Every day | ORAL | 0 refills | Status: AC

## 2021-06-19 NOTE — ED Notes (Signed)
Pt ambulated to restroom independently, steady gait

## 2021-06-19 NOTE — ED Notes (Signed)
Pt states her last BM was 3-4 days ago, she states she is passing gas, and states she did not try a laxative or anything at home

## 2021-06-19 NOTE — ED Triage Notes (Signed)
Pt c/o constipation and urinary retention. Pt states since she had covid over 1 month ago, her "GI system has not been normal". EMS unable to state when last bm was

## 2021-06-19 NOTE — ED Provider Notes (Addendum)
[facility]-EMERGENCY DEPT Provider Note   [name]: [id number] Arrival date & time: 06/19/21  0737     History Chief Complaint  Patient presents with   Constipation    Andrea Schneider is a 60 y.o. female.  HPI  Patient presents with 3 days of constipation.  Has not tried any alleviating factors.  Straining makes it worse, not drinking fluids makes it worse.  Some associated abdominal pain and nausea.  She is passing gas, passing small stools, passed stools earlier today.  Feels like she has tenesmus.  Small streaks of blood on toilet paper.  No known history of hemorrhoids.  No previous abdominal surgeries.  No recent changes to medication or diet.  She had COVID 3 to 4 weeks ago, states she has not felt right ever since then.   Past Medical History:  Diagnosis Date   Allergy    Asthma    COPD (chronic obstructive pulmonary disease) (HCC)    Diabetes mellitus without complication (HCC)    Reflux     Patient Active Problem List   Diagnosis Date Noted   Allergic rhinitis 07/02/2015   Insomnia 04/05/2015   Moderate persistent asthma 04/04/2015   VIRAL HEPATITIS 02/29/2008   INSOMNIA 02/29/2008    Past Surgical History:  Procedure Laterality Date   TONSILLECTOMY  1985     OB History   No obstetric history on file.     Family History  Problem Relation Age of Onset   Heart disease Mother    Diabetes Father    Heart disease Father    Heart disease Sister    Diabetes Brother    Heart disease Brother    Hypertension Brother     Social History   Tobacco Use   Smoking status: Former    Packs/day: 1.25    Years: 12.00    Pack years: 15.00    Types: Cigarettes    Quit date: 02/20/2015    Years since quitting: 6.3   Smokeless tobacco: Never  Substance Use Topics   Alcohol use: Yes    Alcohol/week: 0.0 standard drinks    Comment: Occassional   Drug use: No    Home Medications Prior to Admission medications   Medication Sig Start Date End  Date Taking? Authorizing Provider  acetaminophen (TYLENOL) 500 MG tablet Take 1,000 mg by mouth as needed for mild pain.    [provider]  albuterol (PROVENTIL HFA;VENTOLIN HFA) 108 (90 BASE) MCG/ACT inhaler Inhale 2 puffs into the lungs every 6 (six) hours as needed for wheezing or shortness of breath. 01/28/15   Ozella Rocks, MD  chlorpheniramine-HYDROcodone [facility] PENNKINETIC ER) 10-8 MG/5ML LQCR Take 2.5-5 mLs by mouth at bedtime as needed for cough. Patient not taking: Reported on 12/21/2018 02/03/15   Benjiman Core, MD  DiphenhydrAMINE HCl (BENADRYL PO) Take 5-10 mLs by mouth at bedtime as needed. For sleep    [provider]  HYDROcodone-homatropine (HYCODAN) 5-1.5 MG/5ML syrup Take 5 mLs by mouth at bedtime as needed. Patient not taking: Reported on 12/21/2018 08/02/14   Le, Thao P, DO  ipratropium (ATROVENT) 0.06 % nasal spray Place 2 sprays into both nostrils 4 (four) times daily. Patient not taking: Reported on 07/02/2015 01/28/15   Ozella Rocks, MD  mometasone-formoterol [facility]) 100-5 MCG/ACT AERO Inhale 2 puffs into the lungs 2 (two) times daily. 04/04/15 07/02/15  Kalman Shan, MD  mometasone-formoterol (DULERA) 100-5 MCG/ACT AERO Inhale 2 puffs into the lungs 2 (two) times daily. 05/15/15  05/16/15  Parrett, Virgel Bouquet, NP  mometasone-formoterol (DULERA) 200-5 MCG/ACT AERO Inhale 2 puffs into the lungs 2 (two) times daily. 07/02/15 07/03/15  Parrett, Virgel Bouquet, NP  mometasone-formoterol (DULERA) 200-5 MCG/ACT AERO Inhale 2 puffs into the lungs 2 (two) times daily. 07/02/15   Parrett, Virgel Bouquet, NP  ondansetron (ZOFRAN ODT) 4 MG disintegrating tablet Take 1 tablet (4 mg total) by mouth every 8 (eight) hours as needed for nausea or vomiting. 12/21/18   Harlene Salts A, PA-C  Phenyleph-Doxylamine-DM-APAP (NYQUIL SEVERE COLD/FLU) 5-6.25-10-325 MG/15ML LIQD Take 15 mLs by mouth as needed (cold symptoms).    [provider]    Allergies    Codeine and  Penicillins  Review of Systems   Review of Systems  Constitutional:  Positive for appetite change. Negative for fever.  Respiratory:  Negative for cough and shortness of breath.   Gastrointestinal:  Positive for abdominal pain, anal bleeding, constipation and nausea. Negative for vomiting.  Genitourinary:  Negative for dysuria, hematuria and urgency.   Physical Exam Updated Vital Signs BP 132/76 (BP Location: Left Arm)   Pulse 89   Temp 98 F (36.7 C) (Oral)   Resp 16   Ht 5\' 4"  (1.626 m)   Wt 78.9 kg   SpO2 99%   BMI 29.87 kg/m   Physical Exam Vitals and nursing note reviewed. Exam conducted with a chaperone present.  Constitutional:      General: She is not in acute distress.    Appearance: Normal appearance.  HENT:     Head: Normocephalic and atraumatic.  Eyes:     General: No scleral icterus.    Extraocular Movements: Extraocular movements intact.     Pupils: Pupils are equal, round, and reactive to light.  Abdominal:     General: Abdomen is flat. Bowel sounds are normal. There is no distension.     Tenderness: There is abdominal tenderness.     Comments: Abdomen is soft, diffusely tender.  No rigidity, no guarding.  Rectum has hemorrhoid at 12 o'clock position.  Not actively bleeding, appears to be external.  Not prolapsed, not swollen, not tender to touch.  Skin:    Coloration: Skin is not jaundiced.  Neurological:     Mental Status: She is alert. Mental status is at baseline.     Coordination: Coordination normal.    ED Results / Procedures / Treatments   Labs (all labs ordered are listed, but only abnormal results are displayed) Labs Reviewed - No data to display  EKG None  Radiology No results found.  Procedures Procedures   Medications Ordered in ED Medications - No data to display  ED Course  I have reviewed the triage vital signs and the nursing notes.  Pertinent labs & imaging results that were available during my care of the patient were  reviewed by me and considered in my medical decision making (see chart for details).    MDM Rules/Calculators/A&P                           Patient is a 60 year old female presenting with constipation x3 days.  Physical exam is reassuring, diffuse tenderness not localized toward peritoneal signs.  Bowel sounds present in all quadrants.    I doubt SBO given lack of surgical history, that she is passing gas and having small bowel movements, no rigidity, guarding, distention on physical exam.  Doubt ileus given no surgical history.  Patient did have hemorrhoids on exam  consistent with constipation.  I suspect the constipation is likely due to dehydration following her COVID.  She has not tried any over-the-counter medicine, her vital signs are stable, she is afebrile, and nontoxic-appearing.  I will check her electrolyte level, and if there is no electrolyte derangement I will discharge her with instructions for laxatives and fiber supplements and hemorrhoid medicine.  CMP was not notable for electrolyte derangement or potassium suggesting more consistent with an ileus.  CBC is mildly elevated to 10.6, but not high enough for me to be concerned about infectious or inflammatory etiology.  Patient reports significant improvement with fluids and nausea medicine. Vital signs are stable, she is passing gas and having bowel movements.   Patient vital signs are stable, pain is improved.  Discussed that this is likely long-haul COVID symptoms.  Advise first-line symptomatic treatment, advised follow-up with PCP if no improvement in the next few weeks.  Patient is agreeable to plan and voiced understanding.  Strict return precautions were given.  Final Clinical Impression(s) / ED Diagnoses Final diagnoses:  None    Rx / DC Orders ED Discharge Orders     None        Theron Arista, PA-C 06/19/21 [address] PA-C 06/19/21 1322    Alvira Monday, MD 06/20/21 [phone]

## 2021-06-19 NOTE — Discharge Instructions (Addendum)
You were seen today in the emergency department for constipation.  Your work-up today was very reassuring, I believe the constipation is likely due to long COVID symptoms.  I would like you to take Zofran as needed every 6 hours for nausea and vomiting.  Continue to drink lots of fluids.  Eat high-fiber foods and please take the fenofibric supplements once daily.  I like you to pick up MiraLAX, which is a laxative.  Mix 1 scoop in 1 cup of water or juice.  This should help clean you out, and if it does not please take 2 scoops and 1 cup of water.  You can use as much and as needed little as needed.  If condition change or worsen I would like you to follow-up with the emergency department.  Please set up an appointment in 2 weeks to revisit your symptoms with your primary care provider.  Additionally, you are diagnosed with hemorrhoids in the ED.  These are likely caused by constipation.  Please apply Preparation H twice daily directly to your rectum.

## 2023-04-03 ENCOUNTER — Emergency Department (HOSPITAL_COMMUNITY)
Admission: EM | Admit: 2023-04-03 | Discharge: 2023-04-03 | Disposition: A | Discharge status: Home / Self Care | Payer: Self-pay | Attending: Emergency Medicine | Admitting: Emergency Medicine

## 2023-04-03 ENCOUNTER — Encounter (HOSPITAL_COMMUNITY): Payer: Self-pay | Admitting: *Deleted

## 2023-04-03 ENCOUNTER — Other Ambulatory Visit: Payer: Self-pay

## 2023-04-03 ENCOUNTER — Emergency Department (HOSPITAL_COMMUNITY): Payer: Self-pay

## 2023-04-03 DIAGNOSIS — E119 Type 2 diabetes mellitus without complications: Secondary | ICD-10-CM | POA: Insufficient documentation

## 2023-04-03 DIAGNOSIS — Z1152 Encounter for screening for COVID-19: Secondary | ICD-10-CM | POA: Insufficient documentation

## 2023-04-03 DIAGNOSIS — J449 Chronic obstructive pulmonary disease, unspecified: Secondary | ICD-10-CM | POA: Insufficient documentation

## 2023-04-03 DIAGNOSIS — R051 Acute cough: Secondary | ICD-10-CM | POA: Insufficient documentation

## 2023-04-03 LAB — CBC WITH DIFFERENTIAL/PLATELET
Abs Immature Granulocytes: 0.02 10*3/uL (ref 0.00–0.07)
Basophils Absolute: 0.1 10*3/uL (ref 0.0–0.1)
Basophils Relative: 1 %
Eosinophils Absolute: 0.2 10*3/uL (ref 0.0–0.5)
Eosinophils Relative: 2 %
HCT: 42.8 % (ref 36.0–46.0)
Hemoglobin: 14.2 g/dL (ref 12.0–15.0)
Immature Granulocytes: 0 %
Lymphocytes Relative: 15 %
Lymphs Abs: 1.3 10*3/uL (ref 0.7–4.0)
MCH: 28.9 pg (ref 26.0–34.0)
MCHC: 33.2 g/dL (ref 30.0–36.0)
MCV: 87 fL (ref 80.0–100.0)
Monocytes Absolute: 0.7 10*3/uL (ref 0.1–1.0)
Monocytes Relative: 8 %
Neutro Abs: 6.2 10*3/uL (ref 1.7–7.7)
Neutrophils Relative %: 74 %
Platelets: 280 10*3/uL (ref 150–400)
RBC: 4.92 MIL/uL (ref 3.87–5.11)
RDW: 12.8 % (ref 11.5–15.5)
WBC: 8.4 10*3/uL (ref 4.0–10.5)
nRBC: 0 % (ref 0.0–0.2)

## 2023-04-03 LAB — BASIC METABOLIC PANEL
Anion gap: 9 (ref 5–15)
BUN: 7 mg/dL — ABNORMAL LOW (ref 8–23)
CO2: 25 mmol/L (ref 22–32)
Calcium: 9.4 mg/dL (ref 8.9–10.3)
Chloride: 101 mmol/L (ref 98–111)
Creatinine, Ser: 0.59 mg/dL (ref 0.44–1.00)
GFR, Estimated: >60 mL/min (ref >60)
Glucose, Bld: 195 mg/dL — ABNORMAL HIGH (ref 70–99)
Potassium: 3.8 mmol/L (ref 3.5–5.1)
Sodium: 135 mmol/L (ref 135–145)

## 2023-04-03 LAB — RESP PANEL BY RT-PCR (RSV, FLU A&B, COVID)  RVPGX2
Influenza A by PCR: NEGATIVE
Influenza B by PCR: NEGATIVE
Resp Syncytial Virus by PCR: NEGATIVE
SARS Coronavirus 2 by RT PCR: NEGATIVE

## 2023-04-03 MED ORDER — ALBUTEROL SULFATE HFA 108 (90 BASE) MCG/ACT IN AERS
4.0000 | INHALATION_SPRAY | Freq: Once | RESPIRATORY_TRACT | Status: AC
  Administered 2023-04-03: 4 via RESPIRATORY_TRACT
  Filled 2023-04-03: qty 6.7

## 2023-04-03 MED ORDER — BENZONATATE 100 MG PO CAPS: 100.0000 mg | ORAL_CAPSULE | Freq: Three times a day (TID) | ORAL | 0 refills | Status: AC

## 2023-04-03 MED ORDER — PREDNISONE 20 MG PO TABS: 40.0000 mg | ORAL_TABLET | Freq: Every day | ORAL | 0 refills | Status: AC

## 2023-04-03 MED ORDER — BENZONATATE 100 MG PO CAPS
100.0000 mg | ORAL_CAPSULE | Freq: Once | ORAL | Status: AC
  Administered 2023-04-03: 100 mg via ORAL
  Filled 2023-04-03: qty 1

## 2023-04-03 MED ORDER — METHYLPREDNISOLONE SODIUM SUCC 125 MG IJ SOLR
125.0000 mg | Freq: Once | INTRAMUSCULAR | Status: AC
  Administered 2023-04-03: 125 mg via INTRAVENOUS
  Filled 2023-04-03: qty 2

## 2023-04-03 NOTE — ED Triage Notes (Signed)
Cough for couple of weeks, this morning woke with headache and low grade fever.

## 2023-04-03 NOTE — ED Notes (Addendum)
Ambulated pt in hallway while monitoring Pulse Oximetry, pt tolerated it well oxygen sat stayed above 97% during ambulation

## 2023-04-03 NOTE — Discharge Instructions (Addendum)
It was a pleasure taking care of you today.  As discussed, your workup was reassuring.  Chest x-ray did not show evidence of pneumonia.  I suspect you are having a viral infection causing a cough.  I am sending you home with steroids.  Take for 5 days.  Continue to monitor glucose levels as steroids will increase your glucose.  I am also sending you home with cough medication.  Take as needed.  Return to the ER for new or worsening symptoms.

## 2023-04-03 NOTE — ED Provider Notes (Signed)
[place] EMERGENCY DEPARTMENT AT [facility] Provider Note   [name]: [id number] Arrival date & time: 04/03/23  4098     History  Chief Complaint  Patient presents with   Cough   Fever    Andrea Schneider is a 62 y.o. female with a past medical history significant for COPD, diabetes, and seasonal allergies who presents to the ED due to persistent dry cough x 3 weeks.  Patient states she woke up this morning diaphoretic and checked her temperature which was 100 F.  Patient admits to a right-sided headache and myalgias.  Also endorses shortness of breath and wheeze. No longer smokes cigarettes. No associated chest pain.  No lower extremity edema.  No history of blood clots.  No history of CHF.  Denies abdominal pain.  No nausea, vomiting, or diarrhea.  History obtained from patient and past medical records. No interpreter used during encounter.       Home Medications Prior to Admission medications   Medication Sig Start Date End Date Taking? Authorizing Provider  benzonatate (TESSALON) 100 MG capsule Take 1 capsule (100 mg total) by mouth every 8 (eight) hours. 04/03/23  Yes Ranae Casebier, Merla Riches, PA-C  predniSONE (DELTASONE) 20 MG tablet Take 2 tablets (40 mg total) by mouth daily for 5 days. 04/03/23 04/08/23 Yes Michol Emory, Merla Riches, PA-C  acetaminophen (TYLENOL) 500 MG tablet Take 1,000 mg by mouth as needed for mild pain.    [provider]  albuterol (PROVENTIL HFA;VENTOLIN HFA) 108 (90 BASE) MCG/ACT inhaler Inhale 2 puffs into the lungs every 6 (six) hours as needed for wheezing or shortness of breath. 01/28/15   Ozella Rocks, MD  chlorpheniramine-HYDROcodone [facility] PENNKINETIC ER) 10-8 MG/5ML LQCR Take 2.5-5 mLs by mouth at bedtime as needed for cough. Patient not taking: Reported on 12/21/2018 02/03/15   Benjiman Core, MD  DiphenhydrAMINE HCl (BENADRYL PO) Take 5-10 mLs by mouth at bedtime as needed. For sleep    [provider]  fenofibrate  (TRICOR) 48 MG tablet Take 1 tablet (48 mg total) by mouth daily. 06/19/21   Theron Arista, PA-C  HYDROcodone-homatropine (HYCODAN) 5-1.5 MG/5ML syrup Take 5 mLs by mouth at bedtime as needed. Patient not taking: Reported on 12/21/2018 08/02/14   Hamilton Capri P, DO  hydrocortisone cream 1 % Apply to affected area 2 times daily 06/19/21   Theron Arista, PA-C  ipratropium (ATROVENT) 0.06 % nasal spray Place 2 sprays into both nostrils 4 (four) times daily. Patient not taking: Reported on 07/02/2015 01/28/15   Ozella Rocks, MD  mometasone-formoterol [facility]) 100-5 MCG/ACT AERO Inhale 2 puffs into the lungs 2 (two) times daily. 04/04/15 07/02/15  Kalman Shan, MD  mometasone-formoterol (DULERA) 100-5 MCG/ACT AERO Inhale 2 puffs into the lungs 2 (two) times daily. 05/15/15 05/16/15  Parrett, Virgel Bouquet, NP  mometasone-formoterol (DULERA) 200-5 MCG/ACT AERO Inhale 2 puffs into the lungs 2 (two) times daily. 07/02/15 07/03/15  Parrett, Virgel Bouquet, NP  mometasone-formoterol (DULERA) 200-5 MCG/ACT AERO Inhale 2 puffs into the lungs 2 (two) times daily. 07/02/15   Parrett, Virgel Bouquet, NP  ondansetron (ZOFRAN ODT) 4 MG disintegrating tablet Take 1 tablet (4 mg total) by mouth every 8 (eight) hours as needed for nausea or vomiting. 12/21/18   Harlene Salts A, PA-C  ondansetron (ZOFRAN) 4 MG tablet Take 1 tablet (4 mg total) by mouth every 6 (six) hours. 06/19/21   Theron Arista, PA-C  Phenyleph-Doxylamine-DM-APAP (NYQUIL SEVERE COLD/FLU) 5-6.25-10-325 MG/15ML LIQD Take 15 mLs by mouth as  needed (cold symptoms).    [provider]  polyethylene glycol (MIRALAX) 17 g packet Take 17 g by mouth daily. 06/19/21   Theron Arista, PA-C      Allergies    Codeine and Penicillins    Review of Systems   Review of Systems  Constitutional:  Positive for chills and fever.  Respiratory:  Positive for cough and shortness of breath.   Cardiovascular:  Negative for chest pain and leg swelling.  Gastrointestinal:  Negative for abdominal  pain.    Physical Exam Updated Vital Signs BP 118/77   Pulse 85   Temp 98.2 F (36.8 C) (Oral)   Resp (!) 26   Ht 5' 4.5" (1.638 m)   Wt 75.3 kg   SpO2 98%   BMI 28.05 kg/m  Physical Exam Vitals and nursing note reviewed.  Constitutional:      General: She is not in acute distress.    Appearance: She is not ill-appearing.  HENT:     Head: Normocephalic.  Eyes:     Pupils: Pupils are equal, round, and reactive to light.  Cardiovascular:     Rate and Rhythm: Normal rate and regular rhythm.     Pulses: Normal pulses.     Heart sounds: Normal heart sounds. No murmur heard.    No friction rub. No gallop.  Pulmonary:     Effort: Pulmonary effort is normal.     Breath sounds: Normal breath sounds.     Comments: Decreased air movement. No wheeze Abdominal:     General: Abdomen is flat. There is no distension.     Palpations: Abdomen is soft.     Tenderness: There is no abdominal tenderness. There is no guarding or rebound.  Musculoskeletal:        General: Normal range of motion.     Cervical back: Neck supple.     Comments: No lower extremity edema  Skin:    General: Skin is warm and dry.  Neurological:     General: No focal deficit present.     Mental Status: She is alert.  Psychiatric:        Mood and Affect: Mood normal.        Behavior: Behavior normal.     ED Results / Procedures / Treatments   Labs (all labs ordered are listed, but only abnormal results are displayed) Labs Reviewed  BASIC METABOLIC PANEL - Abnormal; Notable for the following components:      Result Value   Glucose, Bld 195 (*)    BUN 7 (*)    All other components within normal limits  RESP PANEL BY RT-PCR (RSV, FLU A&B, COVID)  [id number]  CBC WITH DIFFERENTIAL/PLATELET    EKG None  Radiology DG Chest Portable 1 View  Result Date: 04/03/2023 CLINICAL DATA:  Cough and fever. EXAM: PORTABLE CHEST 1 VIEW COMPARISON:  Chest radiograph dated December 21, 2018. FINDINGS: The heart size and  mediastinal contours are within normal limits. Both lungs are clear. The visualized skeletal structures are unremarkable. IMPRESSION: No active disease. Electronically Signed   By: Larose Hires D.O.   On: 04/03/2023 09:49    Procedures Procedures    Medications Ordered in ED Medications  benzonatate (TESSALON) capsule 100 mg (100 mg Oral Given 04/03/23 0942)  methylPREDNISolone sodium succinate (SOLU-MEDROL) 125 mg/2 mL injection 125 mg (125 mg Intravenous Given 04/03/23 0938)  albuterol (VENTOLIN HFA) 108 (90 Base) MCG/ACT inhaler 4 puff (4 puffs Inhalation Given 04/03/23 0938)  ED Course/ Medical Decision Making/ A&P Clinical Course as of 04/03/23 1111  Fri Apr 03, 2023  1110 Reassessed patient at bedside.  Patient resting comfortably in bed.  Lungs clear to auscultation bilaterally with improvement in air movement.  No wheeze.  No evidence of respiratory distress. [CA]    Clinical Course User Index [CA] Mannie Stabile, PA-C                             Medical Decision Making Amount and/or Complexity of Data Reviewed Labs: ordered. Decision-making details documented in ED Course. Radiology: ordered and independent interpretation performed. Decision-making details documented in ED Course. ECG/medicine tests: ordered and independent interpretation performed. Decision-making details documented in ED Course.  Risk Prescription drug management.   This patient presents to the ED for concern of cough, fever, this involves an extensive number of treatment options, and is a complaint that carries with it a high risk of complications and morbidity.  The differential diagnosis includes viral process, PNA, COPD, CHF, PE, etc  62 year old female presents to the ED due to persistent cough x 3 weeks.  Woke up this morning with a temperature of 100 F.  Also endorses a right-sided headache and shortness of breath.  No chest pain.  History of COPD.  No lower extremity edema.  Upon arrival,  patient afebrile, not tachycardic or hypoxic.  Patient well-appearing on exam.  Decreased air movement without wheeze.  No lower extremity edema.  Low suspicion for CHF exacerbation.  Chest x-ray to rule out evidence of pneumonia.  RVP to rule out infection.  Routine labs ordered.  Given patient reported wheeze earlier today we will give a dose of Solu-Medrol and albuterol.  Tessalon given for cough. Low suspicion for PE given normal heart rate and oxygen saturation.  No lower extremity edema.  No pleuritic chest pain.  CBC unremarkable.  No leukocytosis.  Normal hemoglobin.  Chest x-ray personally reviewed and interpreted which is negative for signs of pneumonia.  EKG demonstrates normal sinus rhythm.  No signs of acute ischemia.  Low suspicion for ACS.  BMP significant for hyperglycemia 195.  No anion gap.  Doubt DKA.  Normal creatinine.  No major electrolyte derangements.  Patient able to ambulate in the ED with O2 saturation at 97% with no difficulties.  Possible postviral cough.  Given subjective wheeze at home will treat with steroids.  Suspect viral etiology.  No evidence of sepsis or pneumonia.  No lower extremity edema, tachycardia, or hypoxia to suggest PE.  No evidence to suggest new onset CHF. Patient stable for discharge. Strict ED precautions discussed with patient. Patient states understanding and agrees to plan. Patient discharged home in no acute distress and stable vitals  Hx COPD No PCP       Final Clinical Impression(s) / ED Diagnoses Final diagnoses:  Acute cough    Rx / DC Orders ED Discharge Orders          Ordered    benzonatate (TESSALON) 100 MG capsule  Every 8 hours        04/03/23 1005    predniSONE (DELTASONE) 20 MG tablet  Daily        04/03/23 1107              Mannie Stabile, PA-C 04/03/23 1111    Melene Plan, DO 04/03/23 1118
